# Patient Record
Sex: Female | Born: 1980
Health system: Southern US, Community
[De-identification: ages and names within clinical notes are randomized; demographics above are authoritative.]

## PROBLEM LIST (undated history)

## (undated) DIAGNOSIS — E079 Disorder of thyroid, unspecified: Secondary | ICD-10-CM

## (undated) DIAGNOSIS — Z789 Other specified health status: Secondary | ICD-10-CM

## (undated) DIAGNOSIS — O42913 Preterm premature rupture of membranes, unspecified as to length of time between rupture and onset of labor, third trimester: Secondary | ICD-10-CM

## (undated) DIAGNOSIS — D649 Anemia, unspecified: Secondary | ICD-10-CM

## (undated) HISTORY — DX: Disorder of thyroid, unspecified: E07.9

## (undated) HISTORY — DX: Other specified health status: Z78.9

---

## 2001-11-05 ENCOUNTER — Emergency Department (HOSPITAL_COMMUNITY): Admission: EM | Admit: 2001-11-05 | Discharge: 2001-11-06 | Payer: Self-pay | Admitting: Emergency Medicine

## 2007-02-18 ENCOUNTER — Other Ambulatory Visit: Admission: RE | Admit: 2007-02-18 | Discharge: 2007-02-18 | Payer: Self-pay | Admitting: Gynecology

## 2007-02-18 DIAGNOSIS — Z789 Other specified health status: Secondary | ICD-10-CM

## 2007-02-18 HISTORY — DX: Other specified health status: Z78.9

## 2007-10-24 ENCOUNTER — Emergency Department (HOSPITAL_COMMUNITY): Admission: EM | Admit: 2007-10-24 | Discharge: 2007-10-25 | Payer: Self-pay | Admitting: Emergency Medicine

## 2008-02-20 ENCOUNTER — Other Ambulatory Visit: Admission: RE | Admit: 2008-02-20 | Discharge: 2008-02-20 | Payer: Self-pay | Admitting: Gynecology

## 2009-01-18 ENCOUNTER — Ambulatory Visit: Payer: Self-pay | Admitting: Women's Health

## 2009-02-21 ENCOUNTER — Other Ambulatory Visit: Admission: RE | Admit: 2009-02-21 | Discharge: 2009-02-21 | Payer: Self-pay | Admitting: Gynecology

## 2009-02-21 ENCOUNTER — Ambulatory Visit: Payer: Self-pay | Admitting: Women's Health

## 2009-02-21 ENCOUNTER — Encounter: Payer: Self-pay | Admitting: Women's Health

## 2009-03-14 ENCOUNTER — Ambulatory Visit: Payer: Self-pay | Admitting: Women's Health

## 2009-03-27 ENCOUNTER — Ambulatory Visit: Payer: Self-pay | Admitting: Women's Health

## 2009-11-18 ENCOUNTER — Inpatient Hospital Stay (HOSPITAL_COMMUNITY): Admission: AD | Admit: 2009-11-18 | Discharge: 2009-11-20 | Payer: Self-pay | Admitting: Obstetrics

## 2010-05-22 ENCOUNTER — Ambulatory Visit: Payer: Self-pay | Admitting: Women's Health

## 2010-09-22 LAB — CBC
HCT: 36.7 % (ref 36.0–46.0)
HCT: 36.9 % (ref 36.0–46.0)
Hemoglobin: 10 g/dL — ABNORMAL LOW (ref 12.0–15.0)
MCHC: 34.4 g/dL (ref 30.0–36.0)
MCV: 82.8 fL (ref 78.0–100.0)
MCV: 92 fL (ref 78.0–100.0)
Platelets: 204 10*3/uL (ref 150–400)
Platelets: 228 10*3/uL (ref 150–400)
RBC: 3.47 MIL/uL — ABNORMAL LOW (ref 3.87–5.11)
RDW: 15.3 % (ref 11.5–15.5)
WBC: 10.8 10*3/uL — ABNORMAL HIGH (ref 4.0–10.5)

## 2010-09-22 LAB — COMPREHENSIVE METABOLIC PANEL
AST: 33 U/L (ref 0–37)
Albumin: 2.8 g/dL — ABNORMAL LOW (ref 3.5–5.2)
BUN: 7 mg/dL (ref 6–23)
Creatinine, Ser: 0.67 mg/dL (ref 0.4–1.2)
GFR calc Af Amer: >60 mL/min (ref >60)
Potassium: 4.2 mEq/L (ref 3.5–5.1)
Total Protein: 5.9 g/dL — ABNORMAL LOW (ref 6.0–8.3)

## 2010-09-22 LAB — URIC ACID: Uric Acid, Serum: 6.3 mg/dL (ref 2.4–7.0)

## 2010-09-22 LAB — RPR: RPR Ser Ql: NONREACTIVE

## 2010-11-04 ENCOUNTER — Other Ambulatory Visit (HOSPITAL_COMMUNITY): Payer: Self-pay | Admitting: Endocrinology

## 2010-11-04 DIAGNOSIS — E0590 Thyrotoxicosis, unspecified without thyrotoxic crisis or storm: Secondary | ICD-10-CM

## 2010-11-13 ENCOUNTER — Encounter (HOSPITAL_COMMUNITY)
Admission: RE | Admit: 2010-11-13 | Discharge: 2010-11-13 | Disposition: A | Discharge status: Home / Self Care | Payer: BC Managed Care – PPO | Source: Ambulatory Visit | Attending: Endocrinology | Admitting: Endocrinology

## 2010-11-13 DIAGNOSIS — E0590 Thyrotoxicosis, unspecified without thyrotoxic crisis or storm: Secondary | ICD-10-CM | POA: Insufficient documentation

## 2010-11-14 ENCOUNTER — Encounter (HOSPITAL_COMMUNITY)
Admission: RE | Admit: 2010-11-14 | Discharge: 2010-11-14 | Disposition: A | Discharge status: Home / Self Care | Payer: BC Managed Care – PPO | Source: Ambulatory Visit | Attending: Endocrinology | Admitting: Endocrinology

## 2010-11-14 MED ORDER — SODIUM IODIDE I 131 CAPSULE
8.6000 | Freq: Once | INTRAVENOUS | Status: AC | PRN
  Administered 2010-11-13: 8.6 via ORAL

## 2010-11-14 MED ORDER — SODIUM PERTECHNETATE TC 99M INJECTION
10.4000 | Freq: Once | INTRAVENOUS | Status: AC | PRN
  Administered 2010-11-14: 10.4 via INTRAVENOUS

## 2011-04-02 ENCOUNTER — Telehealth: Payer: Self-pay | Admitting: *Deleted

## 2011-04-02 NOTE — Telephone Encounter (Signed)
Patient just wanted to know if we ever gave her a TDAP vaccine.  Answer was no.

## 2011-04-15 DIAGNOSIS — J45909 Unspecified asthma, uncomplicated: Secondary | ICD-10-CM | POA: Insufficient documentation

## 2011-04-24 ENCOUNTER — Ambulatory Visit (INDEPENDENT_AMBULATORY_CARE_PROVIDER_SITE_OTHER): Payer: BC Managed Care – PPO | Admitting: Women's Health

## 2011-04-24 ENCOUNTER — Encounter: Payer: Self-pay | Admitting: Women's Health

## 2011-04-24 ENCOUNTER — Other Ambulatory Visit (HOSPITAL_COMMUNITY)
Admission: RE | Admit: 2011-04-24 | Discharge: 2011-04-24 | Disposition: A | Discharge status: Home / Self Care | Payer: BC Managed Care – PPO | Source: Ambulatory Visit | Attending: Women's Health | Admitting: Women's Health

## 2011-04-24 VITALS — BP 110/70 | Ht 64.25 in | Wt 181.0 lb

## 2011-04-24 DIAGNOSIS — Z01419 Encounter for gynecological examination (general) (routine) without abnormal findings: Secondary | ICD-10-CM

## 2011-04-24 DIAGNOSIS — E079 Disorder of thyroid, unspecified: Secondary | ICD-10-CM | POA: Insufficient documentation

## 2011-04-24 DIAGNOSIS — R823 Hemoglobinuria: Secondary | ICD-10-CM

## 2011-04-24 NOTE — Progress Notes (Signed)
Samantha Koch March 29, 1981 [id number]    History:    The patient presents for annual exam.  Middle school music teacher. Pt's Father watches daughter [name],17 months.   Past medical history, past surgical history, family history and social history were all reviewed and documented in the EPIC chart.   ROS:  A  ROS was performed and pertinent positives and negatives are included in the history.  Exam:  Filed Vitals:   04/24/11 1546  BP: 110/70    General appearance:  Normal Head/Neck:  Normal, without cervical or supraclavicular adenopathy. Thyroid:  Symmetrical, normal in size, without palpable masses or nodularity. Respiratory  Effort:  Normal  Auscultation:  Clear without wheezing or rhonchi Cardiovascular  Auscultation:  Regular rate, without rubs, murmurs or gallops  Edema/varicosities:  Not grossly evident Abdominal  Soft,nontender, without masses, guarding or rebound.  Liver/spleen:  No organomegaly noted  Hernia:  None appreciated  Skin  Inspection:  Grossly normal  Palpation:  Grossly normal Neurologic/psychiatric  Orientation:  Normal with appropriate conversation.  Mood/affect:  Normal  Genitourinary    Breasts: Examined lying and sitting.     Right: Without masses, retractions, discharge or axillary adenopathy.     Left: Without masses, retractions, discharge or axillary adenopathy.   Inguinal/mons:  Normal without inguinal adenopathy  External genitalia:  Normal  BUS/Urethra/Skene's glands:  Normal  Bladder:  Normal  Vagina:  Normal  Cervix:  Normal  Uterus:   normal in size, shape and contour.  Midline and mobile  Adnexa/parametria:     Rt: Without masses or tenderness.   Lt: Without masses or tenderness.  Anus and perineum: Normal  Digital rectal exam: Normal sphincter tone without palpated masses or tenderness  Assessment/Plan:  30 y.o. MWF G1P1 for annual exam monthly 4-5 day cycle/condoms. History of thyroid problems triggered with pregnancy.  Had negative thyroid scan in May 2012 normal TSH and does have followup next month.  Normal GYN exam  Plan: CBC UA and Pap. SBEs exercise, calcium rich diet, continue multivitamin daily. Reviewed importance of cutting calories and increasing exercise for weight loss to get back to her normal weight. Other options for contraception were reviewed and declined. Not planning on  second child for another 2 years.   Harrington Challenger [facility], 4:38 PM 04/24/2011

## 2011-04-30 ENCOUNTER — Other Ambulatory Visit: Payer: Self-pay | Admitting: Women's Health

## 2011-04-30 DIAGNOSIS — D72829 Elevated white blood cell count, unspecified: Secondary | ICD-10-CM

## 2011-12-01 ENCOUNTER — Ambulatory Visit (INDEPENDENT_AMBULATORY_CARE_PROVIDER_SITE_OTHER): Payer: BC Managed Care – PPO | Admitting: Physician Assistant

## 2011-12-01 ENCOUNTER — Encounter: Payer: Self-pay | Admitting: Physician Assistant

## 2011-12-01 VITALS — BP 121/79 | HR 103 | Temp 99.7°F | Resp 16 | Ht 64.5 in | Wt 173.6 lb

## 2011-12-01 DIAGNOSIS — J029 Acute pharyngitis, unspecified: Secondary | ICD-10-CM

## 2011-12-01 DIAGNOSIS — J309 Allergic rhinitis, unspecified: Secondary | ICD-10-CM

## 2011-12-01 DIAGNOSIS — R509 Fever, unspecified: Secondary | ICD-10-CM

## 2011-12-01 DIAGNOSIS — H6690 Otitis media, unspecified, unspecified ear: Secondary | ICD-10-CM

## 2011-12-01 DIAGNOSIS — M25539 Pain in unspecified wrist: Secondary | ICD-10-CM

## 2011-12-01 LAB — POCT RAPID STREP A (OFFICE): Rapid Strep A Screen: NEGATIVE

## 2011-12-01 MED ORDER — AZITHROMYCIN 250 MG PO TABS: ORAL_TABLET | ORAL | Status: AC

## 2011-12-01 MED ORDER — FLUTICASONE PROPIONATE 50 MCG/ACT NA SUSP: 2.0000 | Freq: Every day | NASAL | Status: DC

## 2011-12-01 MED ORDER — MELOXICAM 15 MG PO TABS: 15.0000 mg | ORAL_TABLET | Freq: Every day | ORAL | Status: AC

## 2011-12-01 NOTE — Progress Notes (Signed)
  Subjective:    Patient ID: Samantha Koch, female    DOB: 1980/10/19, 31 y.o.   MRN: [id number]  HPI Patient presents with sore throat, fever, and left ear pain x 2 days. States symptoms started yesterday morning and have worsened since. Denies nausea, vomiting, nasal congestion, or cough. She has been taking ibuprofen and Dayquil as needed which has helped with the pain and fever. States daughter was sick at over the weekend with a fever and congestion that resolved spontaneously without treatment. 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   She also complains of bilateral wrist pain and numbness especially at night. States she does sleep with her wrists bent or under her head. 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 Has taken ibuprofen prn which has not helped much.    Review of Systems  Constitutional: Positive for fever and chills.  HENT: Positive for ear pain (left). Negative for congestion, rhinorrhea and postnasal drip.   Eyes: Negative for discharge and itching.  Respiratory: Negative for cough and chest tightness.   Musculoskeletal: Positive for myalgias.  Skin: Negative for rash.  Neurological: Positive for headaches (improved with ibuprofen).       Objective:   Physical Exam  Constitutional: She is oriented to person, place, and time. She appears well-developed and well-nourished.  HENT:  Head: Normocephalic and atraumatic.  Right Ear: External ear normal.  Left Ear: External ear normal.  Mouth/Throat: Oropharynx is clear and moist. No oropharyngeal exudate.  Eyes: Conjunctivae are normal.  Neck: Neck supple.  Cardiovascular: Normal rate, regular rhythm and normal heart sounds.   Pulmonary/Chest: Effort normal and breath sounds normal.  Musculoskeletal: Normal range of motion.  Lymphadenopathy:    She has no cervical adenopathy.  Neurological: She is alert and oriented to person, place, and time.  Skin: Skin is warm and dry.    Psychiatric: She has a normal mood and affect. Her behavior is normal. Judgment and thought content normal.          Assessment & Plan:     1. Acute pharyngitis  Likely viral, recommend conservative treatment. If no improvement in 48 hours may fill Zpack POCT rapid strep A, Culture, Group A Strep  2. Fever  Continue ibuprofen or tylenol as needed   3. Otitis media  azithromycin (ZITHROMAX) 250 MG tablet  4. Allergic rhinitis  Changed Nasonex to Flonase for relief of seasonal allergies fluticasone (FLONASE) 50 MCG/ACT nasal spray  5. Wrist pain  Recommend trial of Mobic daily as well as wrist splints at night. Follow up if no improvement meloxicam (MOBIC) 15 MG tablet

## 2011-12-01 NOTE — Patient Instructions (Signed)

## 2011-12-03 LAB — CULTURE, GROUP A STREP: Organism ID, Bacteria: NORMAL

## 2011-12-04 ENCOUNTER — Encounter: Payer: Self-pay | Admitting: *Deleted

## 2011-12-27 ENCOUNTER — Ambulatory Visit (INDEPENDENT_AMBULATORY_CARE_PROVIDER_SITE_OTHER): Payer: BC Managed Care – PPO | Admitting: Emergency Medicine

## 2011-12-27 VITALS — BP 110/73 | HR 73 | Temp 99.0°F | Resp 16 | Ht 64.5 in | Wt 171.0 lb

## 2011-12-27 DIAGNOSIS — J309 Allergic rhinitis, unspecified: Secondary | ICD-10-CM

## 2011-12-27 DIAGNOSIS — H6980 Other specified disorders of Eustachian tube, unspecified ear: Secondary | ICD-10-CM

## 2011-12-27 NOTE — Progress Notes (Signed)
  Subjective:    Patient ID: Samantha Koch, female    DOB: [birth year], 31 y.o.   MRN: [id number]  HPI Comments: Swimming laps while having problems with her allergies and now has sensation that her ears are blocked and full of fluid.  No fever or chills. Clear nasal discharge and post nasal drainage.   No sore throat or cough.  Using flonase  Ear Fullness  There is pain in both ears. This is a new problem. The current episode started in the past 7 days. The problem occurs constantly. The problem has been unchanged. There has been no fever. Associated symptoms include hearing loss and rhinorrhea. Pertinent negatives include no abdominal pain, coughing, diarrhea, drainage, ear discharge, headaches, neck pain, rash, sore throat or vomiting. She has tried nothing for the symptoms. The treatment provided no relief.      Review of Systems  Constitutional: Negative.   HENT: Positive for hearing loss and rhinorrhea. Negative for sore throat, neck pain and ear discharge.   Eyes: Negative.   Respiratory: Negative for cough.   Cardiovascular: Negative.   Gastrointestinal: Negative.  Negative for vomiting, abdominal pain and diarrhea.  Musculoskeletal: Negative.   Skin: Negative for rash.  Neurological: Negative for headaches.       Objective:   Physical Exam  Constitutional: She appears well-developed and well-nourished.  HENT:  Head: Normocephalic and atraumatic.  Right Ear: External ear normal. Tympanic membrane is retracted. A middle ear effusion is present.  Left Ear: External ear normal. Tympanic membrane is retracted. A middle ear effusion is present.  Mouth/Throat: Oropharynx is clear and moist.  Cardiovascular: Normal rate and regular rhythm.   Pulmonary/Chest: Effort normal.  Abdominal: Soft.  Musculoskeletal: Normal range of motion.  Skin: Skin is warm and dry.          Assessment & Plan:  Continue flonase Start Mucinex d Follow up with ENT if not improved

## 2012-01-25 ENCOUNTER — Other Ambulatory Visit: Payer: Self-pay | Admitting: Family Medicine

## 2012-01-25 DIAGNOSIS — N644 Mastodynia: Secondary | ICD-10-CM

## 2012-01-29 ENCOUNTER — Ambulatory Visit
Admission: RE | Admit: 2012-01-29 | Discharge: 2012-01-29 | Disposition: A | Discharge status: Home / Self Care | Payer: BC Managed Care – PPO | Source: Ambulatory Visit | Attending: Family Medicine | Admitting: Family Medicine

## 2012-01-29 ENCOUNTER — Other Ambulatory Visit: Payer: Self-pay | Admitting: Family Medicine

## 2012-01-29 DIAGNOSIS — N644 Mastodynia: Secondary | ICD-10-CM

## 2012-04-28 ENCOUNTER — Encounter: Payer: BC Managed Care – PPO | Admitting: Women's Health

## 2012-05-06 ENCOUNTER — Encounter: Payer: Self-pay | Admitting: Women's Health

## 2012-05-06 ENCOUNTER — Ambulatory Visit (INDEPENDENT_AMBULATORY_CARE_PROVIDER_SITE_OTHER): Payer: BC Managed Care – PPO | Admitting: Women's Health

## 2012-05-06 VITALS — BP 122/80 | Ht 64.5 in | Wt 166.0 lb

## 2012-05-06 DIAGNOSIS — E0590 Thyrotoxicosis, unspecified without thyrotoxic crisis or storm: Secondary | ICD-10-CM

## 2012-05-06 DIAGNOSIS — Z309 Encounter for contraceptive management, unspecified: Secondary | ICD-10-CM

## 2012-05-06 DIAGNOSIS — Z01419 Encounter for gynecological examination (general) (routine) without abnormal findings: Secondary | ICD-10-CM

## 2012-05-06 DIAGNOSIS — IMO0001 Reserved for inherently not codable concepts without codable children: Secondary | ICD-10-CM

## 2012-05-06 LAB — CBC WITH DIFFERENTIAL/PLATELET
Eosinophils Absolute: 0.3 10*3/uL (ref 0.0–0.7)
Lymphocytes Relative: 27 % (ref 12–46)
Lymphs Abs: 3.2 10*3/uL (ref 0.7–4.0)
Neutro Abs: 7.2 10*3/uL (ref 1.7–7.7)
Neutrophils Relative %: 62 % (ref 43–77)
Platelets: 351 10*3/uL (ref 150–400)
RBC: 4.8 MIL/uL (ref 3.87–5.11)
WBC: 11.7 10*3/uL — ABNORMAL HIGH (ref 4.0–10.5)

## 2012-05-06 LAB — GLUCOSE, RANDOM: Glucose, Bld: 81 mg/dL (ref 70–99)

## 2012-05-06 LAB — TSH: TSH: 0.012 u[IU]/mL — ABNORMAL LOW (ref 0.350–4.500)

## 2012-05-06 MED ORDER — NORGESTIMATE-ETH ESTRADIOL 0.25-35 MG-MCG PO TABS: 1.0000 | ORAL_TABLET | Freq: Every day | ORAL | Status: DC

## 2012-05-06 NOTE — Progress Notes (Signed)
Samantha Koch 1980/11/29 [id number]    History:    The patient presents for annual exam.  Monthly cycles/condoms. History of normal Paps. Completed gardasil series in 2009. History of asthma. History of a hyperthyroidism, thyroid ultrasound May 2012 normal, normal TSH in August 2012. Had a normal mammogram with ultrasound 01/2012 for mastodynia.   Past medical history, past surgical history, family history and social history were all reviewed and documented in the EPIC chart. Music teacher middle school, plays the oboe.  Samantha Koch 3 doing well.   ROS:  A  ROS was performed and pertinent positives and negatives are included in the history.  Exam:  Filed Vitals:   05/06/12 0858  BP: 122/80    General appearance:  Normal Head/Neck:  Normal, without cervical or supraclavicular adenopathy. Thyroid:  Symmetrical, normal in size, without palpable masses or nodularity. Respiratory  Effort:  Normal  Auscultation:  Clear without wheezing or rhonchi Cardiovascular  Auscultation:  Regular rate, without rubs, murmurs or gallops  Edema/varicosities:  Not grossly evident Abdominal  Soft,nontender, without masses, guarding or rebound.  Liver/spleen:  No organomegaly noted  Hernia:  None appreciated  Skin  Inspection:  Grossly normal  Palpation:  Grossly normal Neurologic/psychiatric  Orientation:  Normal with appropriate conversation.  Mood/affect:  Normal  Genitourinary    Breasts: Examined lying and sitting/left larger than right always.     Right: Without masses, retractions, discharge or axillary adenopathy.     Left: Without masses, retractions, discharge or axillary adenopathy.   Inguinal/mons:  Normal without inguinal adenopathy  External genitalia:  Normal  BUS/Urethra/Skene's glands:  Normal  Bladder:  Normal  Vagina:  Normal  Cervix:  Normal  Uterus:   normal in size, shape and contour.  Midline and mobile  Adnexa/parametria:     Rt: Without masses or  tenderness.   Lt: Without masses or tenderness.  Anus and perineum: Normal  Digital rectal exam: Normal sphincter tone without palpated masses or tenderness  Assessment/Plan:  31 y.o. M. WF G1 P1 for annual exam without complaint.  Normal GYN exam History of hyperthyroidism postpartum Contraception counseling  Plan: CBC, TSH, glucose, UA. Pap normal 2012, new screening guidelines reviewed. SBE's, exercise, calcium rich diet, MVI daily encouraged. Has lost approximately 20 pounds in the past year with diet and exercise will continue healthy lifestyle. Contraception options reviewed will try Ortho-Cyclen prescription, proper use given and reviewed start with next cycle condoms until then and first month on.   Harrington Challenger [facility], 1:47 PM 05/06/2012

## 2012-05-06 NOTE — Patient Instructions (Addendum)

## 2012-06-16 ENCOUNTER — Other Ambulatory Visit (HOSPITAL_COMMUNITY): Payer: Self-pay | Admitting: Endocrinology

## 2012-06-16 DIAGNOSIS — E0590 Thyrotoxicosis, unspecified without thyrotoxic crisis or storm: Secondary | ICD-10-CM

## 2012-07-11 ENCOUNTER — Encounter (HOSPITAL_COMMUNITY)
Admission: RE | Admit: 2012-07-11 | Discharge: 2012-07-11 | Disposition: A | Discharge status: Home / Self Care | Payer: BC Managed Care – PPO | Source: Ambulatory Visit | Attending: Endocrinology | Admitting: Endocrinology

## 2012-07-11 DIAGNOSIS — E0590 Thyrotoxicosis, unspecified without thyrotoxic crisis or storm: Secondary | ICD-10-CM

## 2012-07-12 ENCOUNTER — Encounter (HOSPITAL_COMMUNITY)
Admission: RE | Admit: 2012-07-12 | Discharge: 2012-07-12 | Disposition: A | Discharge status: Home / Self Care | Payer: BC Managed Care – PPO | Source: Ambulatory Visit | Attending: Endocrinology | Admitting: Endocrinology

## 2012-07-12 MED ORDER — SODIUM IODIDE I 131 CAPSULE
8.0000 | Freq: Once | INTRAVENOUS | Status: AC | PRN
  Administered 2012-07-12: 8 via ORAL

## 2012-07-12 MED ORDER — SODIUM PERTECHNETATE TC 99M INJECTION
11.0000 | Freq: Once | INTRAVENOUS | Status: AC | PRN
  Administered 2012-07-12: 11 via INTRAVENOUS

## 2012-07-25 ENCOUNTER — Other Ambulatory Visit (HOSPITAL_COMMUNITY): Payer: Self-pay | Admitting: Endocrinology

## 2012-07-25 DIAGNOSIS — E0590 Thyrotoxicosis, unspecified without thyrotoxic crisis or storm: Secondary | ICD-10-CM

## 2012-07-27 ENCOUNTER — Ambulatory Visit (HOSPITAL_COMMUNITY): Payer: BC Managed Care – PPO

## 2012-07-28 ENCOUNTER — Ambulatory Visit (HOSPITAL_COMMUNITY)
Admission: RE | Admit: 2012-07-28 | Discharge: 2012-07-28 | Disposition: A | Discharge status: Home / Self Care | Payer: BC Managed Care – PPO | Source: Ambulatory Visit | Attending: Endocrinology | Admitting: Endocrinology

## 2012-07-28 DIAGNOSIS — R599 Enlarged lymph nodes, unspecified: Secondary | ICD-10-CM | POA: Insufficient documentation

## 2012-07-28 DIAGNOSIS — E0590 Thyrotoxicosis, unspecified without thyrotoxic crisis or storm: Secondary | ICD-10-CM | POA: Insufficient documentation

## 2012-08-09 ENCOUNTER — Other Ambulatory Visit (HOSPITAL_COMMUNITY): Payer: Self-pay | Admitting: Endocrinology

## 2012-08-09 DIAGNOSIS — E0590 Thyrotoxicosis, unspecified without thyrotoxic crisis or storm: Secondary | ICD-10-CM

## 2012-09-09 ENCOUNTER — Encounter (HOSPITAL_COMMUNITY)
Admission: RE | Admit: 2012-09-09 | Discharge: 2012-09-09 | Disposition: A | Discharge status: Home / Self Care | Payer: BC Managed Care – PPO | Source: Ambulatory Visit | Attending: Endocrinology | Admitting: Endocrinology

## 2012-09-23 ENCOUNTER — Encounter (HOSPITAL_COMMUNITY)
Admission: RE | Admit: 2012-09-23 | Discharge: 2012-09-23 | Disposition: A | Discharge status: Home / Self Care | Payer: BC Managed Care – PPO | Source: Ambulatory Visit | Attending: Endocrinology | Admitting: Endocrinology

## 2012-09-23 DIAGNOSIS — E0590 Thyrotoxicosis, unspecified without thyrotoxic crisis or storm: Secondary | ICD-10-CM | POA: Insufficient documentation

## 2012-09-23 LAB — HCG, SERUM, QUALITATIVE: Preg, Serum: NEGATIVE

## 2012-09-23 MED ORDER — SODIUM IODIDE I 131 CAPSULE
18.9000 | Freq: Once | INTRAVENOUS | Status: AC | PRN
  Administered 2012-09-23: 18.9 via ORAL

## 2013-05-10 ENCOUNTER — Other Ambulatory Visit (HOSPITAL_COMMUNITY)
Admission: RE | Admit: 2013-05-10 | Discharge: 2013-05-10 | Disposition: A | Discharge status: Home / Self Care | Payer: BC Managed Care – PPO | Source: Ambulatory Visit | Attending: Gynecology | Admitting: Gynecology

## 2013-05-10 ENCOUNTER — Encounter: Payer: Self-pay | Admitting: Women's Health

## 2013-05-10 ENCOUNTER — Ambulatory Visit (INDEPENDENT_AMBULATORY_CARE_PROVIDER_SITE_OTHER): Payer: BC Managed Care – PPO | Admitting: Women's Health

## 2013-05-10 VITALS — BP 116/72 | Ht 64.5 in | Wt 175.6 lb

## 2013-05-10 DIAGNOSIS — F419 Anxiety disorder, unspecified: Secondary | ICD-10-CM

## 2013-05-10 DIAGNOSIS — E079 Disorder of thyroid, unspecified: Secondary | ICD-10-CM

## 2013-05-10 DIAGNOSIS — Z01419 Encounter for gynecological examination (general) (routine) without abnormal findings: Secondary | ICD-10-CM

## 2013-05-10 DIAGNOSIS — Z833 Family history of diabetes mellitus: Secondary | ICD-10-CM

## 2013-05-10 DIAGNOSIS — F341 Dysthymic disorder: Secondary | ICD-10-CM

## 2013-05-10 DIAGNOSIS — F411 Generalized anxiety disorder: Secondary | ICD-10-CM

## 2013-05-10 DIAGNOSIS — F329 Major depressive disorder, single episode, unspecified: Secondary | ICD-10-CM | POA: Insufficient documentation

## 2013-05-10 LAB — CBC WITH DIFFERENTIAL/PLATELET
Basophils Absolute: 0 10*3/uL (ref 0.0–0.1)
Basophils Relative: 0 % (ref 0–1)
Eosinophils Absolute: 0.2 10*3/uL (ref 0.0–0.7)
MCH: 27 pg (ref 26.0–34.0)
MCHC: 34 g/dL (ref 30.0–36.0)
Neutro Abs: 9 10*3/uL — ABNORMAL HIGH (ref 1.7–7.7)
Neutrophils Relative %: 72 % (ref 43–77)
Platelets: 295 10*3/uL (ref 150–400)
RDW: 12.8 % (ref 11.5–15.5)

## 2013-05-10 LAB — GLUCOSE, RANDOM: Glucose, Bld: 104 mg/dL — ABNORMAL HIGH (ref 70–99)

## 2013-05-10 MED ORDER — ALPRAZOLAM 0.25 MG PO TABS: 0.2500 mg | ORAL_TABLET | Freq: Every evening | ORAL | Status: DC | PRN

## 2013-05-10 MED ORDER — CITALOPRAM HYDROBROMIDE 10 MG PO TABS: 10.0000 mg | ORAL_TABLET | Freq: Every day | ORAL | Status: DC

## 2013-05-10 NOTE — Patient Instructions (Signed)

## 2013-05-10 NOTE — Progress Notes (Signed)
Samantha Koch Jan 01, 1981 [id number]    History:    The patient presents for annual exam.  Monthly cycle/condoms. Hyperthyroid postpartum, radioiodine treatment March 2014 now on Synthroid followed by Dr. Talmage Koch. Gardasil series completed 2009. Normal Paps. Normal mammograms 2013 for mastodynia. History of depression and anxiety, Celexa in the past. Denies suicidal ideation or abuse.   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 Parents moved here to help with Samantha Koch. Father hypertension and all grandparents. Mother thyroid disease.   ROS:  A  ROS was performed and pertinent positives and negatives are included in the history.  Exam:  Filed Vitals:   05/10/13 1432  BP: 116/72    General appearance:  Normal Head/Neck:  Normal, without cervical or supraclavicular adenopathy. Thyroid:  Symmetrical, normal in size, without palpable masses or nodularity. Respiratory  Effort:  Normal  Auscultation:  Clear without wheezing or rhonchi Cardiovascular  Auscultation:  Regular rate, without rubs, murmurs or gallops  Edema/varicosities:  Not grossly evident Abdominal  Soft,nontender, without masses, guarding or rebound.  Liver/spleen:  No organomegaly noted  Hernia:  None appreciated  Skin  Inspection:  Grossly normal  Palpation:  Grossly normal Neurologic/psychiatric  Orientation:  Normal with appropriate conversation.  Mood/affect:  Normal  Genitourinary    Breasts: Examined lying and sitting. Left always larger than right    Right: Without masses, retractions, discharge or axillary adenopathy.     Left: Without masses, retractions, discharge or axillary adenopathy.   Inguinal/mons:  Normal without inguinal adenopathy  External genitalia:  Normal  BUS/Urethra/Skene's glands:  Normal  Bladder:  Normal  Vagina:  Normal  Cervix:  Normal  Uterus:   normal  in size, shape and contour.  Midline and mobile  Adnexa/parametria:     Rt: Without masses or tenderness.   Lt: Without masses or tenderness.  Anus and perineum: Normal  Digital rectal exam: Normal sphincter tone without palpated masses or tenderness  Assessment/Plan:  32 y.o.  MWF G1P1 for annual exam with complaint of being short tempered, anxiety with depression.  Normal GYN exam Hypothyroid-Dr. Talmage Koch managing Anxiety/depression  Plan: Contraception options reviewed will continue condoms. Counseling encouraged, Celexa 10 mg, reviewed low-dose may need to increase dosage.(celexa in the past unsure of dosage)  Reviewed importance of increasing leisure, exercise. SBE's, calcium rich diet, MVI daily encouraged. CBC, glucose, UA, Pap. Pap normal 2012, new screening guidelines reviewed.   Samantha Koch [facility], 4:58 PM 05/10/2013

## 2013-05-11 LAB — URINALYSIS W MICROSCOPIC + REFLEX CULTURE
Bacteria, UA: NONE SEEN
Casts: NONE SEEN
Ketones, ur: NEGATIVE mg/dL
Nitrite: NEGATIVE
Protein, ur: NEGATIVE mg/dL
pH: 6 (ref 5.0–8.0)

## 2014-05-07 ENCOUNTER — Encounter: Payer: Self-pay | Admitting: Women's Health

## 2014-05-16 ENCOUNTER — Encounter: Payer: Self-pay | Admitting: Women's Health

## 2014-05-16 ENCOUNTER — Other Ambulatory Visit: Payer: Self-pay | Admitting: Women's Health

## 2014-05-16 ENCOUNTER — Ambulatory Visit (INDEPENDENT_AMBULATORY_CARE_PROVIDER_SITE_OTHER): Payer: BC Managed Care – PPO | Admitting: Women's Health

## 2014-05-16 VITALS — BP 120/84 | Ht 65.0 in | Wt 181.0 lb

## 2014-05-16 DIAGNOSIS — E038 Other specified hypothyroidism: Secondary | ICD-10-CM

## 2014-05-16 DIAGNOSIS — N912 Amenorrhea, unspecified: Secondary | ICD-10-CM

## 2014-05-16 DIAGNOSIS — Z01419 Encounter for gynecological examination (general) (routine) without abnormal findings: Secondary | ICD-10-CM

## 2014-05-16 LAB — CBC WITH DIFFERENTIAL/PLATELET
Basophils Absolute: 0 10*3/uL (ref 0.0–0.1)
Basophils Relative: 0 % (ref 0–1)
EOS ABS: 0.2 10*3/uL (ref 0.0–0.7)
EOS PCT: 2 % (ref 0–5)
HEMATOCRIT: 38.9 % (ref 36.0–46.0)
Hemoglobin: 12.9 g/dL (ref 12.0–15.0)
LYMPHS ABS: 3.2 10*3/uL (ref 0.7–4.0)
Lymphocytes Relative: 26 % (ref 12–46)
MCH: 26.8 pg (ref 26.0–34.0)
MCHC: 33.2 g/dL (ref 30.0–36.0)
MCV: 80.7 fL (ref 78.0–100.0)
MONO ABS: 0.9 10*3/uL (ref 0.1–1.0)
Monocytes Relative: 7 % (ref 3–12)
Neutro Abs: 8.1 10*3/uL — ABNORMAL HIGH (ref 1.7–7.7)
Neutrophils Relative %: 65 % (ref 43–77)
PLATELETS: 334 10*3/uL (ref 150–400)
RBC: 4.82 MIL/uL (ref 3.87–5.11)
RDW: 13.9 % (ref 11.5–15.5)
WBC: 12.4 10*3/uL — ABNORMAL HIGH (ref 4.0–10.5)

## 2014-05-16 LAB — PREGNANCY, URINE: Preg Test, Ur: NEGATIVE

## 2014-05-16 LAB — TSH: TSH: 1.822 u[IU]/mL (ref 0.350–4.500)

## 2014-05-16 NOTE — Patient Instructions (Signed)

## 2014-05-16 NOTE — Progress Notes (Signed)
Samantha QuitterMarcia A Koch [birth year] [id number]    History:    Presents for annual exam.  Monthly cycle, stopped contraception last month planning second baby. No problems with first pregnancy. Hyperthyroid had radioactive iodine 09/2012 doing well Dr. Talmage NapBalan manages, on Synthroid. History of anxiety and depression in the past had been on Celexa is no longer on any medication doing well. Normal Pap history.  Past medical history, past surgical history, family history and social history were all reviewed and documented in the EPIC chart. 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 [place] helping to care for Samantha Koch.  ROS:  A  12 point ROS was performed and pertinent positives and negatives are included.  Exam:  Filed Vitals:   05/16/14 0905  BP: 120/84    General appearance:  Normal Thyroid:  Symmetrical, normal in size, without palpable masses or nodularity. Respiratory  Auscultation:  Clear without wheezing or rhonchi Cardiovascular  Auscultation:  Regular rate, without rubs, murmurs or gallops  Edema/varicosities:  Not grossly evident Abdominal  Soft,nontender, without masses, guarding or rebound.  Liver/spleen:  No organomegaly noted  Hernia:  None appreciated  Skin  Inspection:  Grossly normal   Breasts: Examined lying and sitting.     Right: Without masses, retractions, discharge or axillary adenopathy.     Left: Without masses, retractions, discharge or axillary adenopathy. Gentitourinary   Inguinal/mons:  Normal without inguinal adenopathy  External genitalia:  Normal  BUS/Urethra/Skene's glands:  Normal  Vagina:  Normal  Cervix:  Normal  Uterus:   normal in size, shape and contour.  Midline and mobile  Adnexa/parametria:     Rt: Without masses or tenderness.   Lt: Without masses or tenderness.  Anus and perineum: Normal  Digital rectal exam: Normal sphincter tone without palpated masses or tenderness  Assessment/Plan:  33 y.o.MWF  G1P1  for annual examwith no complaints.  Hypothyroid on Synthroid Desiring conception  Plan: UPT negative, will return to office with positive home UPT for viability and dating ultrasound. Aware we no longer deliver. SBE's, continue regular exercise, calcium rich diet, prenatal vitamin daily. Aware of safe pregnancy behaviors. CBC, TSH, UA, Pap normal 2014, new screening guidelines reviewed.    Samantha Koch,Samantha Koch [facility] Hospital[facility]NP, 9:43 AM 05/16/2014

## 2014-06-20 ENCOUNTER — Ambulatory Visit (INDEPENDENT_AMBULATORY_CARE_PROVIDER_SITE_OTHER): Payer: BC Managed Care – PPO | Admitting: Women's Health

## 2014-06-20 ENCOUNTER — Encounter: Payer: Self-pay | Admitting: Women's Health

## 2014-06-20 VITALS — BP 126/80 | Ht 64.0 in | Wt 184.0 lb

## 2014-06-20 DIAGNOSIS — N912 Amenorrhea, unspecified: Secondary | ICD-10-CM

## 2014-06-20 LAB — PREGNANCY, URINE: Preg Test, Ur: POSITIVE

## 2014-06-20 NOTE — Patient Instructions (Signed)
First Trimester of Pregnancy The first trimester of pregnancy is from week 1 until the end of week 12 (months 1 through 3). A week after a sperm fertilizes an egg, the egg will implant on the wall of the uterus. This embryo will begin to develop into a baby. Genes from you and your partner are forming the baby. The female genes determine whether the baby is a boy or a girl. At 6-8 weeks, the eyes and face are formed, and the heartbeat can be seen on ultrasound. At the end of 12 weeks, all the baby's organs are formed.  Now that you are pregnant, you will want to do everything you can to have a healthy baby. Two of the most important things are to get good prenatal care and to follow your health care provider's instructions. Prenatal care is all the medical care you receive before the baby's birth. This care will help prevent, find, and treat any problems during the pregnancy and childbirth. BODY CHANGES Your body goes through many changes during pregnancy. The changes vary from woman to woman.   You may gain or lose a couple of pounds at first.  You may feel sick to your stomach (nauseous) and throw up (vomit). If the vomiting is uncontrollable, call your health care provider.  You may tire easily.  You may develop headaches that can be relieved by medicines approved by your health care provider.  You may urinate more often. Painful urination may mean you have a bladder infection.  You may develop heartburn as a result of your pregnancy.  You may develop constipation because certain hormones are causing the muscles that push waste through your intestines to slow down.  You may develop hemorrhoids or swollen, bulging veins (varicose veins).  Your breasts may begin to grow larger and become tender. Your nipples may stick out more, and the tissue that surrounds them (areola) may become darker.  Your gums may bleed and may be sensitive to brushing and flossing.  Dark spots or blotches (chloasma,  mask of pregnancy) may develop on your face. This will likely fade after the baby is born.  Your menstrual periods will stop.  You may have a loss of appetite.  You may develop cravings for certain kinds of food.  You may have changes in your emotions from day to day, such as being excited to be pregnant or being concerned that something may go wrong with the pregnancy and baby.  You may have more vivid and strange dreams.  You may have changes in your hair. These can include thickening of your hair, rapid growth, and changes in texture. Some women also have hair loss during or after pregnancy, or hair that feels dry or thin. Your hair will most likely return to normal after your baby is born. WHAT TO EXPECT AT YOUR PRENATAL VISITS During a routine prenatal visit:  You will be weighed to make sure you and the baby are growing normally.  Your blood pressure will be taken.  Your abdomen will be measured to track your baby's growth.  The fetal heartbeat will be listened to starting around week 10 or 12 of your pregnancy.  Test results from any previous visits will be discussed. Your health care provider may ask you:  How you are feeling.  If you are feeling the baby move.  If you have had any abnormal symptoms, such as leaking fluid, bleeding, severe headaches, or abdominal cramping.  If you have any questions. Other tests   that may be performed during your first trimester include:  Blood tests to find your blood type and to check for the presence of any previous infections. They will also be used to check for low iron levels (anemia) and Rh antibodies. Later in the pregnancy, blood tests for diabetes will be done along with other tests if problems develop.  Urine tests to check for infections, diabetes, or protein in the urine.  An ultrasound to confirm the proper growth and development of the baby.  An amniocentesis to check for possible genetic problems.  Fetal screens for  spina bifida and Down syndrome.  You may need other tests to make sure you and the baby are doing well. HOME CARE INSTRUCTIONS  Medicines  Follow your health care provider's instructions regarding medicine use. Specific medicines may be either safe or unsafe to take during pregnancy.  Take your prenatal vitamins as directed.  If you develop constipation, try taking a stool softener if your health care provider approves. Diet  Eat regular, well-balanced meals. Choose a variety of foods, such as meat or vegetable-based protein, fish, milk and low-fat dairy products, vegetables, fruits, and whole grain breads and cereals. Your health care provider will help you determine the amount of weight gain that is right for you.  Avoid raw meat and uncooked cheese. These carry germs that can cause birth defects in the baby.  Eating four or five small meals rather than three large meals a day may help relieve nausea and vomiting. If you start to feel nauseous, eating a few soda crackers can be helpful. Drinking liquids between meals instead of during meals also seems to help nausea and vomiting.  If you develop constipation, eat more high-fiber foods, such as fresh vegetables or fruit and whole grains. Drink enough fluids to keep your urine clear or pale yellow. Activity and Exercise  Exercise only as directed by your health care provider. Exercising will help you:  Control your weight.  Stay in shape.  Be prepared for labor and delivery.  Experiencing pain or cramping in the lower abdomen or low back is a good sign that you should stop exercising. Check with your health care provider before continuing normal exercises.  Try to avoid standing for long periods of time. Move your legs often if you must stand in one place for a long time.  Avoid heavy lifting.  Wear low-heeled shoes, and practice good posture.  You may continue to have sex unless your health care provider directs you  otherwise. Relief of Pain or Discomfort  Wear a good support bra for breast tenderness.   Take warm sitz baths to soothe any pain or discomfort caused by hemorrhoids. Use hemorrhoid cream if your health care provider approves.   Rest with your legs elevated if you have leg cramps or low back pain.  If you develop varicose veins in your legs, wear support hose. Elevate your feet for 15 minutes, 3-4 times a day. Limit salt in your diet. Prenatal Care  Schedule your prenatal visits by the twelfth week of pregnancy. They are usually scheduled monthly at first, then more often in the last 2 months before delivery.  Write down your questions. Take them to your prenatal visits.  Keep all your prenatal visits as directed by your health care provider. Safety  Wear your seat belt at all times when driving.  Make a list of emergency phone numbers, including numbers for family, friends, the hospital, and police and fire departments. General Tips    Ask your health care provider for a referral to a local prenatal education class. Begin classes no later than at the beginning of month 6 of your pregnancy.  Ask for help if you have counseling or nutritional needs during pregnancy. Your health care provider can offer advice or refer you to specialists for help with various needs.  Do not use hot tubs, steam rooms, or saunas.  Do not douche or use tampons or scented sanitary pads.  Do not cross your legs for long periods of time.  Avoid cat litter boxes and soil used by cats. These carry germs that can cause birth defects in the baby and possibly loss of the fetus by miscarriage or stillbirth.  Avoid all smoking, herbs, alcohol, and medicines not prescribed by your health care provider. Chemicals in these affect the formation and growth of the baby.  Schedule a dentist appointment. At home, brush your teeth with a soft toothbrush and be gentle when you floss. SEEK MEDICAL CARE IF:   You have  dizziness.  You have mild pelvic cramps, pelvic pressure, or nagging pain in the abdominal area.  You have persistent nausea, vomiting, or diarrhea.  You have a bad smelling vaginal discharge.  You have pain with urination.  You notice increased swelling in your face, hands, legs, or ankles. SEEK IMMEDIATE MEDICAL CARE IF:   You have a fever.  You are leaking fluid from your vagina.  You have spotting or bleeding from your vagina.  You have severe abdominal cramping or pain.  You have rapid weight gain or loss.  You vomit blood or material that looks like coffee grounds.  You are exposed to German measles and have never had them.  You are exposed to fifth disease or chickenpox.  You develop a severe headache.  You have shortness of breath.  You have any kind of trauma, such as from a fall or a car accident. Document Released: 06/16/2001 Document Revised: 11/06/2013 Document Reviewed: 05/02/2013 Exit[employer] Patient Information 2015 [employer]. This information is not intended to replace advice given to you by your health care provider. Make sure you discuss any questions you have with your health care provider.  

## 2014-06-20 NOTE — Progress Notes (Signed)
Patient ID: [name], female   DOB: 01/21/1981, 33 y.o.   MRN: [id number] Presents with home positive UPT. Stopped contraception in October, last cycle November 14 normal flow. Denies abdominal pain, bleeding, discharge or urinary symptoms. Some nausea without vomiting. Taking prenatal vitamin and Synthroid daily.   Exam: Appears well. UPT positive  Early pregnancy  Plan: Continue prenatal vitamins daily, safe pregnancy behaviors reviewed. Schedule viability ultrasound first week in January. [facility]ware [facility] no longer [facility], [facility] who is 4-1/2. Continue Synthroid, normal TSH last month.

## 2014-06-22 ENCOUNTER — Ambulatory Visit: Payer: BC Managed Care – PPO | Admitting: Women's Health

## 2014-07-05 ENCOUNTER — Other Ambulatory Visit: Payer: Self-pay | Admitting: Women's Health

## 2014-07-05 DIAGNOSIS — O3680X1 Pregnancy with inconclusive fetal viability, fetus 1: Secondary | ICD-10-CM

## 2014-07-05 DIAGNOSIS — N912 Amenorrhea, unspecified: Secondary | ICD-10-CM

## 2014-07-06 NOTE — L&D Delivery Note (Signed)
Delivery Note  After preterm PROM at 34 wks, unclear etiology of PROM, and good progress on pitocin At 2:25 PM a viable female was delivered via Vaginal, Spontaneous Delivery (Presentation: Left Occiput Anterior).  APGAR: 9, ; weight 5 lb 12.4 oz (2620 g).   Placenta status: Intact, Spontaneous.  Cord: 3 vessels with the following complications: None.  Cord pH: pending  Anesthesia: Epidural  Episiotomy: None Lacerations: 2nd degree Suture Repair: 3.0 vicryl rapide Est. Blood Loss (mL): 100  Mom to postpartum.  Baby to NICU.    Rickardo Brinegar A. 01/18/2015, 3:16 PM

## 2014-07-12 ENCOUNTER — Ambulatory Visit: Payer: BC Managed Care – PPO | Admitting: Women's Health

## 2014-07-12 ENCOUNTER — Other Ambulatory Visit: Payer: BC Managed Care – PPO

## 2014-07-13 ENCOUNTER — Ambulatory Visit (INDEPENDENT_AMBULATORY_CARE_PROVIDER_SITE_OTHER): Payer: BC Managed Care – PPO | Admitting: Women's Health

## 2014-07-13 ENCOUNTER — Encounter: Payer: Self-pay | Admitting: Women's Health

## 2014-07-13 ENCOUNTER — Ambulatory Visit (INDEPENDENT_AMBULATORY_CARE_PROVIDER_SITE_OTHER): Payer: BC Managed Care – PPO

## 2014-07-13 DIAGNOSIS — O3680X0 Pregnancy with inconclusive fetal viability, not applicable or unspecified: Secondary | ICD-10-CM

## 2014-07-13 DIAGNOSIS — N912 Amenorrhea, unspecified: Secondary | ICD-10-CM

## 2014-07-13 DIAGNOSIS — O3680X1 Pregnancy with inconclusive fetal viability, fetus 1: Secondary | ICD-10-CM

## 2014-07-13 DIAGNOSIS — E038 Other specified hypothyroidism: Secondary | ICD-10-CM

## 2014-07-13 LAB — US OB TRANSVAGINAL

## 2014-07-13 NOTE — Patient Instructions (Signed)
First Trimester of Pregnancy The first trimester of pregnancy is from week 1 until the end of week 12 (months 1 through 3). A week after a sperm fertilizes an egg, the egg will implant on the wall of the uterus. This embryo will begin to develop into a baby. Genes from you and your partner are forming the baby. The female genes determine whether the baby is a boy or a girl. At 6-8 weeks, the eyes and face are formed, and the heartbeat can be seen on ultrasound. At the end of 12 weeks, all the baby's organs are formed.  Now that you are pregnant, you will want to do everything you can to have a healthy baby. Two of the most important things are to get good prenatal care and to follow your health care provider's instructions. Prenatal care is all the medical care you receive before the baby's birth. This care will help prevent, find, and treat any problems during the pregnancy and childbirth. BODY CHANGES Your body goes through many changes during pregnancy. The changes vary from woman to woman.   You may gain or lose a couple of pounds at first.  You may feel sick to your stomach (nauseous) and throw up (vomit). If the vomiting is uncontrollable, call your health care provider.  You may tire easily.  You may develop headaches that can be relieved by medicines approved by your health care provider.  You may urinate more often. Painful urination may mean you have a bladder infection.  You may develop heartburn as a result of your pregnancy.  You may develop constipation because certain hormones are causing the muscles that push waste through your intestines to slow down.  You may develop hemorrhoids or swollen, bulging veins (varicose veins).  Your breasts may begin to grow larger and become tender. Your nipples may stick out more, and the tissue that surrounds them (areola) may become darker.  Your gums may bleed and may be sensitive to brushing and flossing.  Dark spots or blotches (chloasma,  mask of pregnancy) may develop on your face. This will likely fade after the baby is born.  Your menstrual periods will stop.  You may have a loss of appetite.  You may develop cravings for certain kinds of food.  You may have changes in your emotions from day to day, such as being excited to be pregnant or being concerned that something may go wrong with the pregnancy and baby.  You may have more vivid and strange dreams.  You may have changes in your hair. These can include thickening of your hair, rapid growth, and changes in texture. Some women also have hair loss during or after pregnancy, or hair that feels dry or thin. Your hair will most likely return to normal after your baby is born. WHAT TO EXPECT AT YOUR PRENATAL VISITS During a routine prenatal visit:  You will be weighed to make sure you and the baby are growing normally.  Your blood pressure will be taken.  Your abdomen will be measured to track your baby's growth.  The fetal heartbeat will be listened to starting around week 10 or 12 of your pregnancy.  Test results from any previous visits will be discussed. Your health care provider may ask you:  How you are feeling.  If you are feeling the baby move.  If you have had any abnormal symptoms, such as leaking fluid, bleeding, severe headaches, or abdominal cramping.  If you have any questions. Other tests   that may be performed during your first trimester include:  Blood tests to find your blood type and to check for the presence of any previous infections. They will also be used to check for low iron levels (anemia) and Rh antibodies. Later in the pregnancy, blood tests for diabetes will be done along with other tests if problems develop.  Urine tests to check for infections, diabetes, or protein in the urine.  An ultrasound to confirm the proper growth and development of the baby.  An amniocentesis to check for possible genetic problems.  Fetal screens for  spina bifida and Down syndrome.  You may need other tests to make sure you and the baby are doing well. HOME CARE INSTRUCTIONS  Medicines  Follow your health care provider's instructions regarding medicine use. Specific medicines may be either safe or unsafe to take during pregnancy.  Take your prenatal vitamins as directed.  If you develop constipation, try taking a stool softener if your health care provider approves. Diet  Eat regular, well-balanced meals. Choose a variety of foods, such as meat or vegetable-based protein, fish, milk and low-fat dairy products, vegetables, fruits, and whole grain breads and cereals. Your health care provider will help you determine the amount of weight gain that is right for you.  Avoid raw meat and uncooked cheese. These carry germs that can cause birth defects in the baby.  Eating four or five small meals rather than three large meals a day may help relieve nausea and vomiting. If you start to feel nauseous, eating a few soda crackers can be helpful. Drinking liquids between meals instead of during meals also seems to help nausea and vomiting.  If you develop constipation, eat more high-fiber foods, such as fresh vegetables or fruit and whole grains. Drink enough fluids to keep your urine clear or pale yellow. Activity and Exercise  Exercise only as directed by your health care provider. Exercising will help you:  Control your weight.  Stay in shape.  Be prepared for labor and delivery.  Experiencing pain or cramping in the lower abdomen or low back is a good sign that you should stop exercising. Check with your health care provider before continuing normal exercises.  Try to avoid standing for long periods of time. Move your legs often if you must stand in one place for a long time.  Avoid heavy lifting.  Wear low-heeled shoes, and practice good posture.  You may continue to have sex unless your health care provider directs you  otherwise. Relief of Pain or Discomfort  Wear a good support bra for breast tenderness.   Take warm sitz baths to soothe any pain or discomfort caused by hemorrhoids. Use hemorrhoid cream if your health care provider approves.   Rest with your legs elevated if you have leg cramps or low back pain.  If you develop varicose veins in your legs, wear support hose. Elevate your feet for 15 minutes, 3-4 times a day. Limit salt in your diet. Prenatal Care  Schedule your prenatal visits by the twelfth week of pregnancy. They are usually scheduled monthly at first, then more often in the last 2 months before delivery.  Write down your questions. Take them to your prenatal visits.  Keep all your prenatal visits as directed by your health care provider. Safety  Wear your seat belt at all times when driving.  Make a list of emergency phone numbers, including numbers for family, friends, the hospital, and police and fire departments. General Tips    Ask your health care provider for a referral to a local prenatal education class. Begin classes no later than at the beginning of month 6 of your pregnancy.  Ask for help if you have counseling or nutritional needs during pregnancy. Your health care provider can offer advice or refer you to specialists for help with various needs.  Do not use hot tubs, steam rooms, or saunas.  Do not douche or use tampons or scented sanitary pads.  Do not cross your legs for long periods of time.  Avoid cat litter boxes and soil used by cats. These carry germs that can cause birth defects in the baby and possibly loss of the fetus by miscarriage or stillbirth.  Avoid all smoking, herbs, alcohol, and medicines not prescribed by your health care provider. Chemicals in these affect the formation and growth of the baby.  Schedule a dentist appointment. At home, brush your teeth with a soft toothbrush and be gentle when you floss. SEEK MEDICAL CARE IF:   You have  dizziness.  You have mild pelvic cramps, pelvic pressure, or nagging pain in the abdominal area.  You have persistent nausea, vomiting, or diarrhea.  You have a bad smelling vaginal discharge.  You have pain with urination.  You notice increased swelling in your face, hands, legs, or ankles. SEEK IMMEDIATE MEDICAL CARE IF:   You have a fever.  You are leaking fluid from your vagina.  You have spotting or bleeding from your vagina.  You have severe abdominal cramping or pain.  You have rapid weight gain or loss.  You vomit blood or material that looks like coffee grounds.  You are exposed to German measles and have never had them.  You are exposed to fifth disease or chickenpox.  You develop a severe headache.  You have shortness of breath.  You have any kind of trauma, such as from a fall or a car accident. Document Released: 06/16/2001 Document Revised: 11/06/2013 Document Reviewed: 05/02/2013 Exit[employer] Patient Information 2015 [employer]. This information is not intended to replace advice given to you by your health care provider. Make sure you discuss any questions you have with your health care provider.  

## 2014-07-13 NOTE — Progress Notes (Signed)
Presents for viability ultrasound. LMP 05/19/2014, normal cycles.  . Normal Pap history. Denies abdominal pain, bleeding, discharge. G2P1, healthy/normal 1st pregnancy 4 years ago. Having daily nausea without vomiting and fatigue.  Exam: Well appearing  Transvaginal Ultrasound: Intrauterine gestational sac seen with a yolk sac. A fetal pole seen with FHT. Ovaries appear normal with a CLC seen on right ovary. No free fluid seen. Fetal heart rate 162 bpm. S=D 7 weeks 6 days  1st Trimester Pregnancy Nausea Hypothyroid  Plan: PNV with iron daily. Safe pregnancy behaviors reviewed. Educated to maintain healthy diet and exercise within personal limits. Encouraged Development worker, [facility] to set up appointment.  Unisom with B6 for nausea. TSH pending continue same dosage. Synthroid 112.

## 2014-07-14 LAB — TSH: TSH: 6.068 u[IU]/mL — AB (ref 0.350–4.500)

## 2014-07-26 LAB — OB RESULTS CONSOLE HEPATITIS B SURFACE ANTIGEN: Hepatitis B Surface Ag: NEGATIVE

## 2014-07-26 LAB — OB RESULTS CONSOLE HIV ANTIBODY (ROUTINE TESTING): HIV: NONREACTIVE

## 2014-07-26 LAB — OB RESULTS CONSOLE ANTIBODY SCREEN: Antibody Screen: NEGATIVE

## 2014-07-26 LAB — OB RESULTS CONSOLE RPR: RPR: NONREACTIVE

## 2014-07-26 LAB — OB RESULTS CONSOLE RUBELLA ANTIBODY, IGM: Rubella: UNDETERMINED

## 2014-07-26 LAB — OB RESULTS CONSOLE ABO/RH: RH TYPE: POSITIVE

## 2014-08-03 LAB — OB RESULTS CONSOLE GC/CHLAMYDIA
Chlamydia: NEGATIVE
Gonorrhea: NEGATIVE

## 2015-01-18 ENCOUNTER — Inpatient Hospital Stay (HOSPITAL_COMMUNITY): Payer: BC Managed Care – PPO | Admitting: Anesthesiology

## 2015-01-18 ENCOUNTER — Inpatient Hospital Stay (HOSPITAL_COMMUNITY)
Admission: AD | Admit: 2015-01-18 | Discharge: 2015-01-20 | DRG: 775 | Disposition: A | Discharge status: Home / Self Care | Payer: BC Managed Care – PPO | Source: Ambulatory Visit | Attending: Obstetrics | Admitting: Obstetrics

## 2015-01-18 ENCOUNTER — Encounter (HOSPITAL_COMMUNITY): Payer: Self-pay

## 2015-01-18 DIAGNOSIS — Z8249 Family history of ischemic heart disease and other diseases of the circulatory system: Secondary | ICD-10-CM | POA: Diagnosis not present

## 2015-01-18 DIAGNOSIS — Z3A34 34 weeks gestation of pregnancy: Secondary | ICD-10-CM | POA: Diagnosis present

## 2015-01-18 DIAGNOSIS — F329 Major depressive disorder, single episode, unspecified: Secondary | ICD-10-CM | POA: Diagnosis present

## 2015-01-18 DIAGNOSIS — E890 Postprocedural hypothyroidism: Secondary | ICD-10-CM | POA: Diagnosis present

## 2015-01-18 DIAGNOSIS — O42013 Preterm premature rupture of membranes, onset of labor within 24 hours of rupture, third trimester: Principal | ICD-10-CM | POA: Diagnosis present

## 2015-01-18 DIAGNOSIS — J45909 Unspecified asthma, uncomplicated: Secondary | ICD-10-CM | POA: Diagnosis present

## 2015-01-18 DIAGNOSIS — O99343 Other mental disorders complicating pregnancy, third trimester: Secondary | ICD-10-CM | POA: Diagnosis present

## 2015-01-18 DIAGNOSIS — F419 Anxiety disorder, unspecified: Secondary | ICD-10-CM | POA: Diagnosis present

## 2015-01-18 DIAGNOSIS — O42913 Preterm premature rupture of membranes, unspecified as to length of time between rupture and onset of labor, third trimester: Secondary | ICD-10-CM

## 2015-01-18 DIAGNOSIS — O99283 Endocrine, nutritional and metabolic diseases complicating pregnancy, third trimester: Secondary | ICD-10-CM | POA: Diagnosis present

## 2015-01-18 DIAGNOSIS — O99513 Diseases of the respiratory system complicating pregnancy, third trimester: Secondary | ICD-10-CM | POA: Diagnosis present

## 2015-01-18 DIAGNOSIS — Y842 Radiological procedure and radiotherapy as the cause of abnormal reaction of the patient, or of later complication, without mention of misadventure at the time of the procedure: Secondary | ICD-10-CM | POA: Diagnosis present

## 2015-01-18 DIAGNOSIS — O99013 Anemia complicating pregnancy, third trimester: Secondary | ICD-10-CM | POA: Diagnosis present

## 2015-01-18 HISTORY — DX: Anemia, unspecified: D64.9

## 2015-01-18 HISTORY — DX: Preterm premature rupture of membranes, unspecified as to length of time between rupture and onset of labor, third trimester: O42.913

## 2015-01-18 LAB — POCT FERN TEST

## 2015-01-18 LAB — CBC
HEMATOCRIT: 35.7 % — AB (ref 36.0–46.0)
Hemoglobin: 11.7 g/dL — ABNORMAL LOW (ref 12.0–15.0)
MCH: 27 pg (ref 26.0–34.0)
MCHC: 32.8 g/dL (ref 30.0–36.0)
MCV: 82.3 fL (ref 78.0–100.0)
PLATELETS: 195 10*3/uL (ref 150–400)
RBC: 4.34 MIL/uL (ref 3.87–5.11)
RDW: 14.9 % (ref 11.5–15.5)
WBC: 18.2 10*3/uL — ABNORMAL HIGH (ref 4.0–10.5)

## 2015-01-18 LAB — GROUP B STREP BY PCR: Group B strep by PCR: NEGATIVE

## 2015-01-18 MED ORDER — TERBUTALINE SULFATE 1 MG/ML IJ SOLN: 0.2500 mg | Freq: Once | INTRAMUSCULAR | Status: DC | PRN

## 2015-01-18 MED ORDER — ONDANSETRON HCL 4 MG PO TABS: 4.0000 mg | ORAL_TABLET | ORAL | Status: DC | PRN

## 2015-01-18 MED ORDER — OXYCODONE-ACETAMINOPHEN 5-325 MG PO TABS: 2.0000 | ORAL_TABLET | ORAL | Status: DC | PRN

## 2015-01-18 MED ORDER — SENNOSIDES-DOCUSATE SODIUM 8.6-50 MG PO TABS
2.0000 | ORAL_TABLET | ORAL | Status: DC
  Administered 2015-01-18 – 2015-01-19 (×2): 2 via ORAL
  Filled 2015-01-18 (×2): qty 2

## 2015-01-18 MED ORDER — DIPHENHYDRAMINE HCL 25 MG PO CAPS: 25.0000 mg | ORAL_CAPSULE | Freq: Four times a day (QID) | ORAL | Status: DC | PRN

## 2015-01-18 MED ORDER — VANCOMYCIN HCL IN DEXTROSE 1-5 GM/200ML-% IV SOLN
1000.0000 mg | Freq: Two times a day (BID) | INTRAVENOUS | Status: DC
  Administered 2015-01-18: 1000 mg via INTRAVENOUS
  Filled 2015-01-18 (×2): qty 200

## 2015-01-18 MED ORDER — DIBUCAINE 1 % RE OINT: 1.0000 "application " | TOPICAL_OINTMENT | RECTAL | Status: DC | PRN

## 2015-01-18 MED ORDER — BISACODYL 10 MG RE SUPP: 10.0000 mg | Freq: Every day | RECTAL | Status: DC | PRN

## 2015-01-18 MED ORDER — ONDANSETRON HCL 4 MG/2ML IJ SOLN: 4.0000 mg | INTRAMUSCULAR | Status: DC | PRN

## 2015-01-18 MED ORDER — FENTANYL 2.5 MCG/ML BUPIVACAINE 1/10 % EPIDURAL INFUSION (WH - ANES)
14.0000 mL/h | INTRAMUSCULAR | Status: DC | PRN
  Administered 2015-01-18: 14 mL/h via EPIDURAL
  Filled 2015-01-18: qty 125

## 2015-01-18 MED ORDER — IBUPROFEN 600 MG PO TABS
600.0000 mg | ORAL_TABLET | Freq: Four times a day (QID) | ORAL | Status: DC
  Administered 2015-01-18 – 2015-01-20 (×8): 600 mg via ORAL
  Filled 2015-01-18 (×8): qty 1

## 2015-01-18 MED ORDER — OXYTOCIN BOLUS FROM INFUSION: 500.0000 mL | INTRAVENOUS | Status: DC

## 2015-01-18 MED ORDER — LEVOTHYROXINE SODIUM 137 MCG PO TABS
137.0000 ug | ORAL_TABLET | ORAL | Status: DC
  Administered 2015-01-20: 137 ug via ORAL
  Filled 2015-01-18: qty 1

## 2015-01-18 MED ORDER — CITRIC ACID-SODIUM CITRATE 334-500 MG/5ML PO SOLN: 30.0000 mL | ORAL | Status: DC | PRN

## 2015-01-18 MED ORDER — WITCH HAZEL-GLYCERIN EX PADS: 1.0000 "application " | MEDICATED_PAD | CUTANEOUS | Status: DC | PRN

## 2015-01-18 MED ORDER — SIMETHICONE 80 MG PO CHEW: 80.0000 mg | CHEWABLE_TABLET | ORAL | Status: DC | PRN

## 2015-01-18 MED ORDER — PRENATAL MULTIVITAMIN CH
1.0000 | ORAL_TABLET | Freq: Every day | ORAL | Status: DC
  Administered 2015-01-19 – 2015-01-20 (×2): 1 via ORAL
  Filled 2015-01-18 (×2): qty 1

## 2015-01-18 MED ORDER — PHENYLEPHRINE 40 MCG/ML (10ML) SYRINGE FOR IV PUSH (FOR BLOOD PRESSURE SUPPORT)
80.0000 ug | PREFILLED_SYRINGE | INTRAVENOUS | Status: DC | PRN
  Filled 2015-01-18: qty 2
  Filled 2015-01-18: qty 20

## 2015-01-18 MED ORDER — OXYTOCIN 40 UNITS IN LACTATED RINGERS INFUSION - SIMPLE MED
1.0000 m[IU]/min | INTRAVENOUS | Status: DC
  Administered 2015-01-18: 1 m[IU]/min via INTRAVENOUS
  Filled 2015-01-18: qty 1000

## 2015-01-18 MED ORDER — OXYCODONE-ACETAMINOPHEN 5-325 MG PO TABS
1.0000 | ORAL_TABLET | ORAL | Status: DC | PRN
  Administered 2015-01-18 – 2015-01-19 (×5): 1 via ORAL
  Filled 2015-01-18 (×6): qty 1

## 2015-01-18 MED ORDER — EPHEDRINE 5 MG/ML INJ
10.0000 mg | INTRAVENOUS | Status: DC | PRN
  Filled 2015-01-18: qty 2

## 2015-01-18 MED ORDER — OXYTOCIN 40 UNITS IN LACTATED RINGERS INFUSION - SIMPLE MED
62.5000 mL/h | INTRAVENOUS | Status: DC
  Administered 2015-01-18: 62.5 mL/h via INTRAVENOUS

## 2015-01-18 MED ORDER — LACTATED RINGERS IV SOLN: INTRAVENOUS | Status: DC

## 2015-01-18 MED ORDER — SERTRALINE HCL 50 MG PO TABS
50.0000 mg | ORAL_TABLET | Freq: Every day | ORAL | Status: DC
  Administered 2015-01-18 – 2015-01-20 (×3): 50 mg via ORAL
  Filled 2015-01-18 (×4): qty 1

## 2015-01-18 MED ORDER — FLEET ENEMA 7-19 GM/118ML RE ENEM: 1.0000 | ENEMA | Freq: Every day | RECTAL | Status: DC | PRN

## 2015-01-18 MED ORDER — OXYCODONE-ACETAMINOPHEN 5-325 MG PO TABS: 1.0000 | ORAL_TABLET | ORAL | Status: DC | PRN

## 2015-01-18 MED ORDER — LIDOCAINE HCL (PF) 1 % IJ SOLN
INTRAMUSCULAR | Status: DC | PRN
  Administered 2015-01-18: 6 mL

## 2015-01-18 MED ORDER — ONDANSETRON HCL 4 MG/2ML IJ SOLN: 4.0000 mg | Freq: Four times a day (QID) | INTRAMUSCULAR | Status: DC | PRN

## 2015-01-18 MED ORDER — OXYTOCIN 10 UNIT/ML IJ SOLN
10.0000 [IU] | Freq: Once | INTRAMUSCULAR | Status: DC | PRN
Start: 2015-01-18 — End: 2015-01-18
  Filled 2015-01-18: qty 1

## 2015-01-18 MED ORDER — LANOLIN HYDROUS EX OINT
TOPICAL_OINTMENT | CUTANEOUS | Status: DC | PRN
  Administered 2015-01-19: 16:00:00 via TOPICAL

## 2015-01-18 MED ORDER — LEVOTHYROXINE SODIUM 150 MCG PO TABS
150.0000 ug | ORAL_TABLET | ORAL | Status: DC
  Administered 2015-01-19: 150 ug via ORAL
  Filled 2015-01-18 (×2): qty 1

## 2015-01-18 MED ORDER — LACTATED RINGERS IV SOLN: 500.0000 mL | INTRAVENOUS | Status: DC | PRN

## 2015-01-18 MED ORDER — BENZOCAINE-MENTHOL 20-0.5 % EX AERO
1.0000 "application " | INHALATION_SPRAY | CUTANEOUS | Status: DC | PRN
  Administered 2015-01-18: 1 via TOPICAL
  Filled 2015-01-18 (×2): qty 56

## 2015-01-18 MED ORDER — ACETAMINOPHEN 325 MG PO TABS: 650.0000 mg | ORAL_TABLET | ORAL | Status: DC | PRN

## 2015-01-18 MED ORDER — DIPHENHYDRAMINE HCL 50 MG/ML IJ SOLN: 12.5000 mg | INTRAMUSCULAR | Status: DC | PRN

## 2015-01-18 MED ORDER — FENTANYL 2.5 MCG/ML BUPIVACAINE 1/10 % EPIDURAL INFUSION (WH - ANES): 14.0000 mL/h | INTRAMUSCULAR | Status: DC | PRN

## 2015-01-18 MED ORDER — TETANUS-DIPHTH-ACELL PERTUSSIS 5-2.5-18.5 LF-MCG/0.5 IM SUSP: 0.5000 mL | Freq: Once | INTRAMUSCULAR | Status: DC

## 2015-01-18 MED ORDER — ZOLPIDEM TARTRATE 5 MG PO TABS: 5.0000 mg | ORAL_TABLET | Freq: Every evening | ORAL | Status: DC | PRN

## 2015-01-18 MED ORDER — LIDOCAINE HCL (PF) 1 % IJ SOLN
30.0000 mL | INTRAMUSCULAR | Status: DC | PRN
  Filled 2015-01-18: qty 30

## 2015-01-18 NOTE — MAU Provider Note (Signed)
History     CSN: [id number]  Arrival date and time: 01/18/15 6578  Provider contacted: 0820 Provider on unit: 0822 Provider at bedside: [phone]     Chief Complaint  Patient presents with  . rule out ruptured membranes    HPI  Samantha Koch is a 34 yo G2P1001 at 34.6 wks presenting with complaints of rupture of membranes at 0630.  She reports she was lying in bed and felt wetness start.  She has continued to leak a pink tinged clear fluid since then.  When she stood up once arriving to hospital, she had a large gush of the same fluid.  She denies VB; except for the scant pink tinge to the vaginal fluid.  She reports no FM since ROM, but (+) FM since arriving at hospital.  Her GBS status is unknown due to gestation.  Her prenatal care has been complicated by: surgical hypothyroidism, asthma and anemia. Dr. Ernestina Penna is her primary OB provider at [facility].   Past Medical History  Diagnosis Date  . Asthma   . Rubella immune 02/18/07    POS RUBELLA TITER  . Thyroid disease     hyperthyroidism post-pardum/ had radioactive therapy now hypothyroid Spring 2014  . Anemia   . Preterm premature rupture of membranes in third trimester 01/18/2015    History reviewed. No pertinent past surgical history.  Family History  Problem Relation Age of Onset  . Thyroid disease Mother     hypothyroidism  . Hypertension Father   . Hypertension Maternal Grandmother   . Hypertension Maternal Grandfather   . Hypertension Paternal Grandmother   . Hypertension Paternal Grandfather     History  Substance Use Topics  . Smoking status: Never Smoker   . Smokeless tobacco: Never Used  . Alcohol Use: No     Comment: rarely    Allergies:  Allergies  Allergen Reactions  . Penicillins     Prescriptions prior to admission  Medication Sig Dispense Refill Last Dose  . ALBUTEROL IN Inhale into the lungs as needed.    Taking  . cetirizine (ZYRTEC) 10 MG tablet Take 10 mg by mouth as needed.    Taking  .  fluticasone (FLONASE) 50 MCG/ACT nasal spray Place 2 sprays into the nose daily. 16 g 5 Taking  . levothyroxine (SYNTHROID, LEVOTHROID) 112 MCG tablet Take 112 mcg by mouth daily before breakfast.   Taking  . Prenatal Vit-Fe Fumarate-FA (PRENATAL PO) Take 1 tablet by mouth daily.   Taking    Review of Systems  Constitutional: Negative.   HENT: Negative.   Eyes: Negative.   Respiratory: Negative.   Cardiovascular: Negative.   Gastrointestinal: Negative.   Genitourinary:       (+) leaking pink tinged fluid; (+) FM; recent tx for UTI  Musculoskeletal: Negative.   Skin: Negative.   Neurological: Negative.   Endo/Heme/Allergies: Negative.   Psychiatric/Behavioral: Negative.    CEFM FHR: 145 bpm / moderate variability / accels present / no decels TOCO: regular every 5 mins  Results for orders placed or performed during the hospital encounter of 01/18/15 (from the past 24 hour(s))  POCT fern test     Status: Abnormal   Collection Time: 01/18/15  8:04 AM  Result Value Ref Range   POCT Fern Test POSITIVE   Group B strep by PCR     Status: None   Collection Time: 01/18/15  8:55 AM  Result Value Ref Range   Group B strep by PCR NEGATIVE NEGATIVE  CBC     Status: Abnormal   Collection Time: 01/18/15  9:25 AM  Result Value Ref Range   WBC 18.2 (H) 4.0 - 10.5 K/uL   RBC 4.34 3.87 - 5.11 MIL/uL   Hemoglobin 11.7 (L) 12.0 - 15.0 g/dL   HCT 16.135.7 (L) 09.636.0 - 04.546.0 %   MCV 82.3 78.0 - 100.0 fL   MCH 27.0 26.0 - 34.0 pg   MCHC 32.8 30.0 - 36.0 g/dL   RDW 40.914.9 81.111.5 - 91.415.5 %   Platelets 195 150 - 400 K/uL   Physical Exam   Last menstrual period 05/19/2014.  Physical Exam  Constitutional: She is oriented to person, place, and time. She appears well-developed and well-nourished.  HENT:  Head: Normocephalic and atraumatic.  Eyes: Pupils are equal, round, and reactive to light.  Neck: Normal range of motion.  Cardiovascular: Normal rate, regular rhythm, normal heart sounds and intact  distal pulses.   Respiratory: Effort normal and breath sounds normal.  GI: Soft. Bowel sounds are normal.  Genitourinary: Vaginal discharge found.  Small amount of clear blood tinged fluid; VE: 2.5/70%/-3/vtx  Musculoskeletal: Normal range of motion.  Neurological: She is alert and oriented to person, place, and time. She has normal reflexes.  Skin: Skin is warm and dry.  Psychiatric: She has a normal mood and affect. Her behavior is normal. Judgment and thought content normal.    MAU Course  Procedures CEFM Fern - positive Assessment and Plan  34 yo G2P1001 at 34.[redacted] wks gestation PPROM Hypothyroidism in Pregnancy Asthma Anemia   Admit to L&D for PPROM See admission orders Dr. Seymour BarsLavoie notified of assessment, plan and admission - MD care at time of admission  Raelyn MoraDAWSON, Saloma Cadena, M MSN, CNM 01/18/2015, 8:29 AM

## 2015-01-18 NOTE — Anesthesia Preprocedure Evaluation (Signed)
Anesthesia Evaluation  Patient identified by MRN, date of birth, ID band Patient awake    Reviewed: Allergy & Precautions, H&P , Patient's Chart, lab work & pertinent test results  Airway Mallampati: II  TM Distance: >3 FB Neck ROM: full    Dental  (+) Teeth Intact   Pulmonary asthma ,  breath sounds clear to auscultation        Cardiovascular Rhythm:regular Rate:Normal     Neuro/Psych    GI/Hepatic   Endo/Other  Hypothyroidism   Renal/GU      Musculoskeletal   Abdominal   Peds  Hematology   Anesthesia Other Findings       Reproductive/Obstetrics (+) Pregnancy                             Anesthesia Physical Anesthesia Plan  ASA: II  Anesthesia Plan: Epidural   Post-op Pain Management:    Induction:   Airway Management Planned:   Additional Equipment:   Intra-op Plan:   Post-operative Plan:   Informed Consent: I have reviewed the patients History and Physical, chart, labs and discussed the procedure including the risks, benefits and alternatives for the proposed anesthesia with the patient or authorized representative who has indicated his/her understanding and acceptance.   Dental Advisory Given  Plan Discussed with:   Anesthesia Plan Comments: (Labs checked- platelets confirmed with RN in room. Fetal heart tracing, per RN, reported to be stable enough for sitting procedure. Discussed epidural, and patient consents to the procedure:  included risk of possible headache,backache, failed block, allergic reaction, and nerve injury. This patient was asked if she had any questions or concerns before the procedure started.)        Anesthesia Quick Evaluation

## 2015-01-18 NOTE — Consult Note (Signed)
Neonatology Note:   Attendance at Delivery:    I was asked by Dr. Ernestina PennaFogleman to attend this NSVD at 34 6/7 weeks after SROM this morning. The mother is a G2P1 O pos, GBS neg with hypothyroidism, on Synthroid. ROM 8 hours prior to delivery, fluid clear. Patient got Vancomycin during labor before GBS status was known. She was afebrile. Infant vigorous with good spontaneous cry and tone. Delayed cord clamping was done. Needed only minimal bulb suctioning. Ap 9/9. Lungs clear to ausc in DR, no distress. Held by parents briefly, then transported to the NICU for further care.   Doretha Souhristie C. Shawan Corella, MD

## 2015-01-18 NOTE — MAU Note (Signed)
Pt states slow trickle of fluid since 0630, clear fluid. Continues to trickle out. Mild cramping pain.

## 2015-01-18 NOTE — H&P (Signed)
History     CSN: [id number]  Arrival date and time: 01/18/15 [id number]  Provider contacted: 0820 Provider on unit: 0822 Provider at bedside: [phone]     Chief Complaint  Patient presents with  . rule out ruptured membranes    HPI  Ms. Samantha Koch is a 34 yo G2P1001 at 34.6 wks presenting with complaints of rupture of membranes at 0630.  She reports she was lying in bed and felt wetness start.  She has continued to leak a pink tinged clear fluid since then.  When she stood up once arriving to hospital, she had a large gush of the same fluid.  She denies VB; except for the scant pink tinge to the vaginal fluid.  She reports no FM since ROM, but (+) FM since arriving at hospital.  Her GBS status is unknown due to gestation.  Her prenatal care has been complicated by: surgical hypothyroidism, asthma and anemia. Dr. Ernestina PennaFogleman is her primary OB provider at [facility].   Past Medical History  Diagnosis Date  . Asthma   . Rubella immune 02/18/07    POS RUBELLA TITER  . Thyroid disease     hyperthyroidism post-pardum/ had radioactive therapy now hypothyroid Spring 2014  . Anemia   . Preterm premature rupture of membranes in third trimester 01/18/2015    History reviewed. No pertinent past surgical history.  Family History  Problem Relation Age of Onset  . Thyroid disease Mother     hypothyroidism  . Hypertension Father   . Hypertension Maternal Grandmother   . Hypertension Maternal Grandfather   . Hypertension Paternal Grandmother   . Hypertension Paternal Grandfather     History  Substance Use Topics  . Smoking status: Never Smoker   . Smokeless tobacco: Never Used  . Alcohol Use: No     Comment: rarely    Allergies:  Allergies  Allergen Reactions  . Penicillins     Prescriptions prior to admission  Medication Sig Dispense Refill Last Dose  . ALBUTEROL IN Inhale into the lungs as needed.    Taking  . cetirizine (ZYRTEC) 10 MG tablet Take 10 mg by mouth as needed.    Taking  .  fluticasone (FLONASE) 50 MCG/ACT nasal spray Place 2 sprays into the nose daily. 16 g 5 Taking  . levothyroxine (SYNTHROID, LEVOTHROID) 112 MCG tablet Take 112 mcg by mouth daily before breakfast.   Taking  . Prenatal Vit-Fe Fumarate-FA (PRENATAL PO) Take 1 tablet by mouth daily.   Taking    Review of Systems  Constitutional: Negative.   HENT: Negative.   Eyes: Negative.   Respiratory: Negative.   Cardiovascular: Negative.   Gastrointestinal: Negative.   Genitourinary:       (+) leaking pink tinged fluid; (+) FM; recent tx for UTI  Musculoskeletal: Negative.   Skin: Negative.   Neurological: Negative.   Endo/Heme/Allergies: Negative.   Psychiatric/Behavioral: Negative.    CEFM FHR: 145 bpm / moderate variability / accels present / no decels TOCO: regular every 5 mins  Results for orders placed or performed during the hospital encounter of 01/18/15 (from the past 24 hour(s))  POCT fern test     Status: Abnormal   Collection Time: 01/18/15  8:04 AM  Result Value Ref Range   POCT Fern Test POSITIVE   Group B strep by PCR     Status: None   Collection Time: 01/18/15  8:55 AM  Result Value Ref Range   Group B strep by PCR NEGATIVE NEGATIVE  CBC     Status: Abnormal   Collection Time: 01/18/15  9:25 AM  Result Value Ref Range   WBC 18.2 (H) 4.0 - 10.5 K/uL   RBC 4.34 3.87 - 5.11 MIL/uL   Hemoglobin 11.7 (L) 12.0 - 15.0 g/dL   HCT 16.1 (L) 09.6 - 04.5 %   MCV 82.3 78.0 - 100.0 fL   MCH 27.0 26.0 - 34.0 pg   MCHC 32.8 30.0 - 36.0 g/dL   RDW 40.9 81.1 - 91.4 %   Platelets 195 150 - 400 K/uL   Physical Exam   Last menstrual period 05/19/2014.  Physical Exam  Constitutional: She is oriented to person, place, and time. She appears well-developed and well-nourished.  HENT:  Head: Normocephalic and atraumatic.  Eyes: Pupils are equal, round, and reactive to light.  Neck: Normal range of motion.  Cardiovascular: Normal rate, regular rhythm, normal heart sounds and intact  distal pulses.   Respiratory: Effort normal and breath sounds normal.  GI: Soft. Bowel sounds are normal.  Genitourinary: Vaginal discharge found.  Small amount of clear blood tinged fluid; VE: 2.5/70%/-3/vtx  Musculoskeletal: Normal range of motion.  Neurological: She is alert and oriented to person, place, and time. She has normal reflexes.  Skin: Skin is warm and dry.  Psychiatric: She has a normal mood and affect. Her behavior is normal. Judgment and thought content normal.    MAU Course  Procedures CEFM Fern - positive Assessment and Plan  34 yo G2P1001 at 34.[redacted] wks gestation PPROM Hypothyroidism in Pregnancy Asthma Anemia   Admit to L&D for PPROM Routine admission orders GBS by PCR / Clinda if pos Pitocin Augmentation, if not laboring  **Dr. Seymour Bars notified of assessment, plan and admission - MD care at time of admission  Patient seen and evaluated.  Agree with above note. PROM in Spontaneous labor at 34 6/7 wks Low suspicion of Chorio as etiology of PROM.  Decision to cover with Vanco 1 g IV q12 hrs (All to Pen).  GBS neg. Pitocin Augmentation. FHR monitoring Cat 1 Seen by NICU.  Genia Del MD  01/18/2015 at 10:57 am  Kenard Gower MSN, CNM 01/18/2015, 8:29 AM

## 2015-01-18 NOTE — Consult Note (Signed)
[facility] 01/18/2015    10:58 AM  Neonatal Medicine Consultation         Melida QuitterMarcia A Mazurowski          MRN:  [id number]  I was called at the request of the patient's obstetrician (Dr. Seymour BarsLavoie) to speak to this patient due to potential preterm delivery as early as 34 6/7 weeks due to recent premature rupture of membranes.  The patient's prenatal course has been uncomplicated until today when she rupture her membranes.  She is 34 6/7 weeks currently.  She is admitted to L&D, and is receiving treatment that includes vancomycin and induction of labor.  The baby is female.  I reviewed expectations for a baby born at 34 weeks and beyond, including survival, length of stay, morbidities such as respiratory distress, IVH, infection, feeding intolerance, retinopathy.  I described how we provide respiratory and feeding support.  Mom plans to breast feed, which I encouraged as best for the baby (with supplementations for needed calories).  I let mom know that the baby's outlook generally improves the longer she remains undelivered, however there is increasing risk for infection the longer membranes remained ruptured.  Given the decision to induce labor, and given that this is mom's 2nd baby, I would expect this baby to get delivered today or tomorrow.  I spent 20 minutes reviewing the record, speaking to the patient, and entering appropriate documentation.  More than 50% of the time was spent face to face with patient.   _____________________ Electronically Signed By: Angelita InglesMcCrae S. Smith, MD Neonatologist

## 2015-01-18 NOTE — Progress Notes (Signed)
Notified pt in MAU grossly ruptured, fern +, will come see pt

## 2015-01-18 NOTE — Anesthesia Procedure Notes (Signed)

## 2015-01-18 NOTE — MAU Note (Signed)
Started leaking at 0630, clear/pinkish water. Few spots of blood. Some cramping and downward pressure

## 2015-01-19 LAB — CBC
HEMATOCRIT: 34.1 % — AB (ref 36.0–46.0)
Hemoglobin: 10.9 g/dL — ABNORMAL LOW (ref 12.0–15.0)
MCH: 26.5 pg (ref 26.0–34.0)
MCHC: 32 g/dL (ref 30.0–36.0)
MCV: 83 fL (ref 78.0–100.0)
Platelets: 206 10*3/uL (ref 150–400)
RBC: 4.11 MIL/uL (ref 3.87–5.11)
RDW: 15.1 % (ref 11.5–15.5)
WBC: 17.5 10*3/uL — ABNORMAL HIGH (ref 4.0–10.5)

## 2015-01-19 LAB — RPR: RPR: NONREACTIVE

## 2015-01-19 NOTE — Lactation Note (Signed)
This note was copied from the chart of Samantha Koch Samantha Koch. Lactation Consultation Note  Patient Name: Samantha Koch ZOXWR'UToday's Date: 01/19/2015 Reason for consult: Initial assessment;NICU baby;Infant < 6lbs;Late preterm infant Mom had initially started pumping on standard setting, turning pump strength up and became sore with bruised, red nipple/aerola. Changed flange size to 27 and now using coconut oil for lubrication with pumping and denies discomfort. Care for sore nipples reviewed and Mom has comfort gels. Encouraged to pump every 3 hours for 15 minutes on preemie setting to encourage milk production. Reviewed storage guidelines for NICU, reviewed parameters about what volume to expect with pumping.  Mom has NICU booklet and Lactation brochure for review. Mom to call insurance company about pump, 2 week rental program discussed. Mom has pump used with 1st child and plans to bring this in for pressure check. Encouraged to call for questions/concerns.   Maternal Data Has patient been taught Hand Expression?: Yes Does the patient have breastfeeding experience prior to this delivery?: Yes  Feeding Feeding Type: Breast Milk Length of feed: 10 min  LATCH Score/Interventions             Problem noted: Cracked, bleeding, blisters, bruises;Mild/Moderate discomfort Interventions  (Cracked/bleeding/bruising/blister): Expressed breast milk to nipple Interventions (Mild/moderate discomfort): Comfort gels        Lactation Tools Discussed/Used Tools: Pump;Comfort gels Breast pump type: Double-Electric Breast Pump   Consult Status Consult Status: Follow-up Date: 01/20/15 Follow-up type: In-patient    Samantha Koch, Samantha Koch 01/19/2015, 4:54 PM

## 2015-01-19 NOTE — Anesthesia Postprocedure Evaluation (Signed)
Anesthesia Post Note  Patient: Samantha Koch  Procedure(s) Performed: * No procedures listed *  Anesthesia type: Epidural  Patient location: Mother/Baby  Post pain: Pain level controlled  Post assessment: Post-op Vital signs reviewed  Last Vitals:  Filed Vitals:   01/19/15 0554  BP: 129/82  Pulse: 78  Temp: 36.4 C  Resp: 18    Post vital signs: Reviewed  Level of consciousness:alert  Complications: No apparent anesthesia complications

## 2015-01-19 NOTE — Progress Notes (Signed)
PPD #1- SVD  Subjective:   Reports feeling well, perineum sore Tolerating po/ No nausea or vomiting Bleeding is light Pain controlled with Motrin and Percocet Up ad lib / ambulatory / voiding without problems Newborn: breastpumping/NICU   Objective:   VS:  VS:  Filed Vitals:   01/18/15 1703 01/18/15 1805 01/18/15 2111 01/19/15 0554  BP: 118/69 134/71 113/63 129/82  Pulse: 71 73 66 78  Temp: 99.6 F (37.6 C) 98.9 F (37.2 C) 98.1 F (36.7 C) 97.5 F (36.4 C)  TempSrc: Oral Oral Oral Oral  Resp: 16 16 18 18   Height:      Weight:      SpO2: 100% 100% 100% 100%    LABS:  Recent Labs  01/18/15 0925 01/19/15 0537  WBC 18.2* 17.5*  HGB 11.7* 10.9*  PLT 195 206   Blood type: O/Positive/-- (01/21 0000) Rubella: Equivocal (01/21 0000)   I&O: Intake/Output      07/15 0701 - 07/16 0700 07/16 0701 - 07/17 0700   Urine (mL/kg/hr) 0    Blood 100    Total Output 100     Net -100          Urine Occurrence 1 x      Physical Exam: Alert and oriented x3 Abdomen: soft, non-tender, non-distended  Fundus: firm, non-tender, U-2 Perineum: Well approximated, no significant erythema, edema, or drainage; healing well. Lochia: small Extremities: no edema, no calf pain or tenderness    Assessment:  PPD #1 G2P1/ S/P:spontaneous vaginal, 2nd degree laceration H/o depression Surgical Hypothyroidism  Doing well    Plan: Continue Synthroid Start Zoloft as per discussion b/w pt and Dr. Ernestina PennaFogleman Continue routine post partum orders Anticipate D/C home tomorrow Follow-up at [facility] in 2 wks for mood and medication eval   Dailan Pfalzgraf, N MSN, CNM 01/19/2015, 11:38 AM

## 2015-01-20 MED ORDER — OXYCODONE-ACETAMINOPHEN 5-325 MG PO TABS: 1.0000 | ORAL_TABLET | ORAL | Status: DC | PRN

## 2015-01-20 MED ORDER — IBUPROFEN 600 MG PO TABS: 600.0000 mg | ORAL_TABLET | Freq: Four times a day (QID) | ORAL | Status: DC

## 2015-01-20 MED ORDER — SERTRALINE HCL 50 MG PO TABS: 50.0000 mg | ORAL_TABLET | Freq: Every day | ORAL | Status: DC

## 2015-01-20 NOTE — Progress Notes (Signed)
PPD #2- SVD  Subjective:   Reports feeling well, perineum less sore Tolerating po/ No nausea or vomiting Bleeding is light Pain controlled with Motrin and Percocet Up ad lib / ambulatory / voiding without problems Newborn: breastpumping/NICU   Objective:   VS: VS:  Filed Vitals:   01/19/15 1220 01/19/15 1705 01/19/15 2200 01/20/15 0446  BP: 113/57 125/69 130/74 125/80  Pulse: 69 69 61 55  Temp: 97.8 F (36.6 C) 97.7 F (36.5 C) 98.3 F (36.8 C) 97.7 F (36.5 C)  TempSrc: Oral Oral Oral Oral  Resp: 16 16 18 20   Height:      Weight:      SpO2: 100% 100% 100% 100%    LABS:  Recent Labs  01/18/15 0925 01/19/15 0537  WBC 18.2* 17.5*  HGB 11.7* 10.9*  PLT 195 206   Blood type: O/Positive/-- (01/21 0000) Rubella: Equivocal (01/21 0000)                I&O: Intake/Output      07/16 0701 - 07/17 0700 07/17 0701 - 07/18 0700   Urine (mL/kg/hr)     Blood     Total Output       Net              Physical Exam: Alert and oriented X3 Abdomen: soft, non-tender, non-distended  Fundus: firm, non-tender, U-2 Perineum: Well approximated, no significant erythema, edema, or drainage; healing well. Lochia: small Extremities: trace BLE edema, no calf pain or tenderness    Assessment: PPD #2  G2P1/ S/P:spontaneous vaginal at 35 wks, 2nd degree laceration H/o depression Surgical Hypothyroidism  Doing well - stable for discharge home   Plan: Discharge home RX's:  Ibuprofen 600mg  po Q 6 hrs prn pain #30 Refill x 0 Zoloft 50 mg po daily #30, refill x1 Percocet 5/325 1 to 2 po Q 4 hrs prn pain #30 Refill x 0 Continue Synthroid (has Rx) Follow-[facility] wks at [facility] for mood/med evaluation [facility] given    Donette LarryBHAMBRI, Bilal Manzer, N MSN, CNM 01/20/2015, 11:23 AM

## 2015-01-20 NOTE — Progress Notes (Addendum)
Discharge teaching complete. Pt understood all instructions and did not have any questions. Pt ambulated out of the hospital and discharged home to family.  

## 2015-01-20 NOTE — Discharge Summary (Signed)
Obstetric Discharge Summary Reason for Admission: PPROM @34 .6 wks Prenatal Procedures: asthma, anemia, surgical hypothyroidism, remore hx of depression and anxiety Intrapartum Procedures: spontaneous vaginal delivery and Pitcoin augmentation, epidural Postpartum Procedures: none Complications-Operative and Postpartum: 2nd degree perineal laceration HEMOGLOBIN  Date Value Ref Range Status  01/19/2015 10.9* 12.0 - 15.0 g/dL Final   HCT  Date Value Ref Range Status  01/19/2015 34.1* 36.0 - 46.0 % Final    Physical Exam:  General: alert, cooperative and no distress Lochia: appropriate Uterine Fundus: firm Incision: healing well, no significant drainage, no dehiscence, no significant erythema DVT Evaluation: No evidence of DVT seen on physical exam. Negative Homan's sign. No cords or calf tenderness. No significant calf/ankle edema.  Discharge Diagnoses: Preterm pregnancy-delivered  Discharge Information: Date: 01/20/2015 Activity: pelvic rest Diet: routine Medications: PNV, Ibuprofen, Percocet and Synthroid, Zoloft Condition: stable Instructions: refer to practice specific booklet Discharge to: home Follow-up Information    [facility]. Schedule an appointment as soon as possible for a visit in 2 weeks.   Specialty:  Obstetrics and Gynecology   Why:  evaluation of medication and mood   Contact information:   [address] [place] [id number] [phone]       Follow up with Noland FordyceFOGLEMAN,KELLY A., MD. Schedule an appointment as soon as possible for a visit in 6 weeks.   Specialty:  Obstetrics and Gynecology   Contact information:   [address] Severe[address] [place] [id number] [phone]       Newborn Data: Live born female  Birth Weight: 5 lb 12.4 oz (2620 g) APGAR: 9, 9 NICU  Patryce Depriest, N 01/20/2015, 11:32 AM

## 2015-01-21 ENCOUNTER — Ambulatory Visit: Payer: Self-pay

## 2015-01-21 NOTE — Lactation Note (Signed)
This note was copied from the chart of Samantha Rayfield CitizenMarcia Common. Lactation Consultation Note  Patient Name: Samantha Koch EAVWU'JToday's Koch: 01/21/2015 Reason for consult: Follow-up assessment  With this mom of a NICU baby, now 5966 hours old, and 35 2/7 weeks CGA. Mom was doing STS with her baby in the NICU when I spoke to her. She was given 2 DEP membranes, since her other membranes were" lost down the drain". Mom reports an increase in her amount expressed with hand expression. She also latched her baby and reports her sucking well and staying latached for "a while". Mom had no other concerns at this time, but knows to call for lactation as needed.    Maternal Data    Feeding Feeding Type: Breast Milk with Formula added Length of feed: 30 min  LATCH Score/Interventions                      Lactation Tools Discussed/Used     Consult Status Consult Status: PRN Follow-up type: In-patient (NICU)    Alfred LevinsLee, Tawsha Terrero Anne 01/21/2015, 10:52 AM

## 2015-01-22 ENCOUNTER — Ambulatory Visit: Payer: Self-pay

## 2015-01-22 NOTE — Lactation Note (Signed)
This note was copied from the chart of Samantha Koch [place]. Lactation Consultation Note  Spoke to mom and RN at infant's bedside.  RN states baby just completed a good feeding at breast.  She feels baby transferred a good amount of milk.  Mom is pumping 10-15 mls.  Encouraged to continue pumping every 3 hours and put baby to breast as much as possible.  Patient Name: Samantha Koch Cl[place] VWUJW'JToday's Date: 01/22/2015     Maternal Data    Feeding    LATCH Score/Interventions                      Lactation Tools Discussed/Used     Consult Status      Huston FoleyMOULDEN, Deetya Drouillard S 01/22/2015, 3:52 PM

## 2015-01-24 ENCOUNTER — Ambulatory Visit: Payer: Self-pay

## 2015-01-24 NOTE — Lactation Note (Signed)
This note was copied from the chart of Samantha Lyndia Bury. Lactation Consultation Note  NICU RN called to answer mom's questions about pumping.  She is currently breastfeeding baby.  Baby latched well and nursing actively.  Reviewed with mom expectations for late preterm feeding norms.  Explained that even though baby appears to be latching/feeding well most late preterm babies don't transfer milk consistently.  Stressed importance of post pumping the next 2-3 weeks to maintain good milk supply.  Mom currently pumping 40 mls.  Recommended a lactation outpatient appointment about a week after discharge.  Patient Name: Samantha Koch ZOXWR'U Date: 01/24/2015     Maternal Data    Feeding Feeding Type: Breast Fed  LATCH Score/Interventions Latch: Grasps breast easily, tongue down, lips flanged, rhythmical sucking.  Audible Swallowing: Spontaneous and intermittent  Type of Nipple: Everted at rest and after stimulation  Comfort (Breast/Nipple): Soft / non-tender     Hold (Positioning): No assistance needed to correctly position infant at breast.  LATCH Score: 10  Lactation Tools Discussed/Used     Consult Status      Samantha Koch 01/24/2015, 1:32 PM

## 2015-01-25 ENCOUNTER — Ambulatory Visit: Payer: Self-pay

## 2015-01-25 NOTE — Lactation Note (Signed)
This note was copied from the chart of Samantha Evani Shrider. Lactation Consultation Note  Observed baby at breast with good latch and active suckling.  Mom is concerned about possible blisters on right nipple.  Nipple examined and a few milk blebs noted.  Explained to mom these are plugged milk pores and not blisters from latch or pumping.  Instructed to soak breast in warm water and gently rub a washcloth over area to loosen prior to pumping or latching.  If unable to soak in water she can use warm wet washcloth.  If blebs don't resolve instructed to call.  Patient Name: Samantha Koch ZOXWR'U Date: 01/25/2015     Maternal Data    Feeding Feeding Type: Breast Fed Length of feed: 30 min  LATCH Score/Interventions Latch: Grasps breast easily, tongue down, lips flanged, rhythmical sucking.  Audible Swallowing: Spontaneous and intermittent  Type of Nipple: Everted at rest and after stimulation  Comfort (Breast/Nipple): Filling, red/small blisters or bruises, mild/mod discomfort     Hold (Positioning): No assistance needed to correctly position infant at breast.  LATCH Score: 9  Lactation Tools Discussed/Used     Consult Status      Huston Foley 01/25/2015, 4:16 PM

## 2015-02-04 ENCOUNTER — Encounter (HOSPITAL_COMMUNITY): Payer: Self-pay | Admitting: *Deleted

## 2015-08-18 ENCOUNTER — Telehealth: Payer: Self-pay

## 2015-08-18 ENCOUNTER — Ambulatory Visit (INDEPENDENT_AMBULATORY_CARE_PROVIDER_SITE_OTHER): Payer: BC Managed Care – PPO | Admitting: Family Medicine

## 2015-08-18 VITALS — BP 134/80 | HR 91 | Temp 100.0°F | Resp 20 | Ht 65.0 in | Wt 176.8 lb

## 2015-08-18 DIAGNOSIS — R05 Cough: Secondary | ICD-10-CM | POA: Diagnosis not present

## 2015-08-18 DIAGNOSIS — J0101 Acute recurrent maxillary sinusitis: Secondary | ICD-10-CM

## 2015-08-18 DIAGNOSIS — R509 Fever, unspecified: Secondary | ICD-10-CM | POA: Diagnosis not present

## 2015-08-18 DIAGNOSIS — Z789 Other specified health status: Secondary | ICD-10-CM

## 2015-08-18 DIAGNOSIS — N6459 Other signs and symptoms in breast: Secondary | ICD-10-CM

## 2015-08-18 DIAGNOSIS — R059 Cough, unspecified: Secondary | ICD-10-CM

## 2015-08-18 LAB — POCT INFLUENZA A/B
Influenza A, POC: NEGATIVE
Influenza B, POC: NEGATIVE

## 2015-08-18 LAB — POCT CBC
Granulocyte percent: 85.3 %G — AB (ref 37–80)
HCT, POC: 40.3 % (ref 37.7–47.9)
Hemoglobin: 13.4 g/dL (ref 12.2–16.2)
Lymph, poc: 1.1 (ref 0.6–3.4)
MCH: 25.7 pg — AB (ref 27–31.2)
MCHC: 33.1 g/dL (ref 31.8–35.4)
MCV: 77.4 fL — AB (ref 80–97)
MID (cbc): 0.4 (ref 0–0.9)
MPV: 6.8 fL (ref 0–99.8)
POC Granulocyte: 8.8 — AB (ref 2–6.9)
POC LYMPH %: 10.6 % (ref 10–50)
POC MID %: 4.1 %M (ref 0–12)
Platelet Count, POC: 279 10*3/uL (ref 142–424)
RBC: 5.21 M/uL (ref 4.04–5.48)
RDW, POC: 14.7 %
WBC: 10.3 10*3/uL — AB (ref 4.6–10.2)

## 2015-08-18 MED ORDER — AZITHROMYCIN 250 MG PO TABS: ORAL_TABLET | ORAL | Status: DC

## 2015-08-18 NOTE — Telephone Encounter (Signed)
Patient was just seen and states she was told that someone would call her right away with the flu test results. Patient states that it's been an hour and she hasn't heard anything. [phone]

## 2015-08-18 NOTE — Progress Notes (Signed)
Subjective:    Patient ID: Samantha Koch, female    DOB: [birth year], 35 y.o.   MRN: [id number]  08/18/2015  Sinusitis; Fever; Cough; and Shortness of Breath   HPI This 35 y.o. female presents for evaluation of cold symptoms.  Getting on a plane to [place].  +fever onset yesterday; Tmax 101.5; Advil.  Building sinus pressure all week.  +HA.  Ears are congested; no pain.  +ST mild.  +PND. +nasal congestion yellow thick; +coughing; husband and older daughter with cough all week.  Breastfeeding; 49 month old.  Essential oil products for clearing.  No n/v/d.    L breast pain this morning; blocked duct sensation.   Feeding every 3 hours.  Just started this morning at 5:00am; had to pump this morning due to severity.  Had constant issue with nipple blister.     Review of Systems  Constitutional: Positive for fever and fatigue. Negative for chills and diaphoresis.  HENT: Positive for congestion, postnasal drip, rhinorrhea, sinus pressure, sore throat and voice change. Negative for ear pain and trouble swallowing.   Respiratory: Positive for cough. Negative for shortness of breath and wheezing.   Cardiovascular: Negative for chest pain, palpitations and leg swelling.  Gastrointestinal: Negative for nausea, vomiting, abdominal pain, diarrhea and constipation.    Past Medical History  Diagnosis Date  . Asthma   . Rubella immune 02/18/07    POS RUBELLA TITER  . Thyroid disease     hyperthyroidism post-pardum/ had radioactive therapy now hypothyroid Spring 2014  . Anemia   . Preterm premature rupture of membranes in third trimester 01/18/2015  . Postpartum care following vaginal delivery (7/15) 01/18/2015   History reviewed. No pertinent past surgical history. Allergies  Allergen Reactions  . Penicillins Anaphylaxis and Rash    Lips swell    Social History   Social History  . Marital Status: Married    Spouse Name: N/A  . Number of Children: N/A  . Years of Education: N/A    Occupational History  . Not on file.   Social History Main Topics  . Smoking status: Never Smoker   . Smokeless tobacco: Never Used  . Alcohol Use: No     Comment: rarely  . Drug Use: No  . Sexual Activity:    Partners: Male    Birth Control/ Protection: Condom   Other Topics Concern  . Not on file   Social History Narrative   Family History  Problem Relation Age of Onset  . Thyroid disease Mother     hypothyroidism  . Hypertension Father   . Hypertension Maternal Grandmother   . Hypertension Maternal Grandfather   . Hypertension Paternal Grandmother   . Hypertension Paternal Grandfather        Objective:    BP 134/80 mmHg  Pulse 91  Temp(Src) 100 F (37.8 C) (Oral)  Resp 20  Ht  (1.651 m)  Wt 176 lb 12.8 oz (80.196 kg)  BMI 29.42 kg/m2  SpO2 97%  Breastfeeding? Yes Physical Exam  Constitutional: She is oriented to person, place, and time. She appears well-developed and well-nourished. No distress.  HENT:  Head: Normocephalic and atraumatic.  Right Ear: Tympanic membrane, external ear and ear canal normal.  Left Ear: Tympanic membrane, external ear and ear canal normal.  Nose: Mucosal edema and rhinorrhea present. Right sinus exhibits maxillary sinus tenderness. Right sinus exhibits no frontal sinus tenderness. Left sinus exhibits maxillary sinus tenderness. Left sinus exhibits no frontal sinus tenderness.  Mouth/Throat: Uvula  is midline, oropharynx is clear and moist and mucous membranes are normal.  Eyes: Conjunctivae are normal. Pupils are equal, round, and reactive to light.  Neck: Normal range of motion. Neck supple.  Cardiovascular: Normal rate, regular rhythm and normal heart sounds.  Exam reveals no gallop and no friction rub.   No murmur heard. Pulmonary/Chest: Effort normal and breath sounds normal. She has no wheezes. She has no rales. Left breast exhibits tenderness. Left breast exhibits no inverted nipple, no mass, no nipple discharge and no  skin change. Breasts are symmetrical.  L lateral breast with mild TTP but no induration or erythema.  +palplable duct.  Neurological: She is alert and oriented to person, place, and time.  Skin: She is not diaphoretic.  Psychiatric: She has a normal mood and affect. Her behavior is normal.  Nursing note and vitals reviewed.  Results for orders placed or performed in visit on 08/18/15  POCT Influenza A/B  Result Value Ref Range   Influenza A, POC Negative Negative   Influenza B, POC Negative Negative  POCT CBC  Result Value Ref Range   WBC 10.3 (A) 4.6 - 10.2 K/uL   Lymph, poc 1.1 0.6 - 3.4   POC LYMPH PERCENT 10.6 10 - 50 %L   MID (cbc) 0.4 0 - 0.9   POC MID % 4.1 0 - 12 %M   POC Granulocyte 8.8 (A) 2 - 6.9   Granulocyte percent 85.3 (A) 37 - 80 %G   RBC 5.21 4.04 - 5.48 M/uL   Hemoglobin 13.4 12.2 - 16.2 g/dL   HCT, POC 16.1 09.6 - 47.9 %   MCV 77.4 (A) 80 - 97 fL   MCH, POC 25.7 (A) 27 - 31.2 pg   MCHC 33.1 31.8 - 35.4 g/dL   RDW, POC 04.5 %   Platelet Count, POC 279 142 - 424 K/uL   MPV 6.8 0 - 99.8 fL       Assessment & Plan:   1. Acute recurrent maxillary sinusitis   2. Breast engorgement   3. Fever, unspecified   4. Cough   5. Breastfeeding (infant)     Orders Placed This Encounter  Procedures  . POCT Influenza A/B  . POCT CBC   Meds ordered this encounter  Medications  . azithromycin (ZITHROMAX) 250 MG tablet    Sig: Two tablets daily x 5 days    Dispense:  10 tablet    Refill:  0    No Follow-up on file.  Jyrah Blye Paulita Fujita, M.D. [facility] [address] [place], [place]  [address] [phone] phone [phone] fax

## 2015-08-18 NOTE — Patient Instructions (Signed)
1.  Continue Netti pot once daily; also recommend nasal saline solution/spray as needed during the day. 2. Continue Flonase daily.

## 2015-08-19 NOTE — Telephone Encounter (Signed)
Advised patient of results on 2/12 at 12:00pm.

## 2016-02-05 ENCOUNTER — Ambulatory Visit (INDEPENDENT_AMBULATORY_CARE_PROVIDER_SITE_OTHER): Payer: BC Managed Care – PPO | Admitting: Women's Health

## 2016-02-05 ENCOUNTER — Encounter: Payer: Self-pay | Admitting: Women's Health

## 2016-02-05 VITALS — BP 119/79 | Ht 65.0 in | Wt 179.4 lb

## 2016-02-05 DIAGNOSIS — Z01419 Encounter for gynecological examination (general) (routine) without abnormal findings: Secondary | ICD-10-CM | POA: Diagnosis not present

## 2016-02-05 LAB — COMPREHENSIVE METABOLIC PANEL
ALBUMIN: 4.2 g/dL (ref 3.6–5.1)
ALK PHOS: 75 U/L (ref 33–115)
ALT: 10 U/L (ref 6–29)
AST: 14 U/L (ref 10–30)
BUN: 20 mg/dL (ref 7–25)
CALCIUM: 9.5 mg/dL (ref 8.6–10.2)
CO2: 24 mmol/L (ref 20–31)
Chloride: 102 mmol/L (ref 98–110)
Creat: 0.85 mg/dL (ref 0.50–1.10)
Glucose, Bld: 81 mg/dL (ref 65–99)
Potassium: 4.2 mmol/L (ref 3.5–5.3)
Sodium: 138 mmol/L (ref 135–146)
Total Bilirubin: 0.4 mg/dL (ref 0.2–1.2)
Total Protein: 7.3 g/dL (ref 6.1–8.1)

## 2016-02-05 MED ORDER — SERTRALINE HCL 50 MG PO TABS: 50.0000 mg | ORAL_TABLET | Freq: Every day | ORAL | 4 refills | Status: DC

## 2016-02-05 NOTE — Progress Notes (Signed)
BONNEE ESTELA 11/23/1980 [id number]    History:    Presents for annual exam.  Mostly monthly cycle, breast-feeding daughter who is 35-year-old who is now also eating solids. Condoms. Husband planning on vasectomy. Normal Pap history. Healthy pregnancy but delivered at 35 weeks, Sharyl Nimrod is doing well. Malachi Pro is now 6 delivered full-term.  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 Zaidi and depression stable on Zoloft 50 mg. States has struggled with anxiety and depression for years declines need for counseling at this time.  ROS:  A ROS was performed and pertinent positives and negatives are included.  Exam:  Vitals:   02/05/16 1537  BP: 119/79  Weight: 179 lb 6.4 oz (81.4 kg)  Height: 5\' 5"  (1.651 m)   Body mass index is 29.85 kg/m.   General appearance:  Normal Thyroid:  Symmetrical, normal in size, without palpable masses or nodularity. Respiratory  Auscultation:  Clear without wheezing or rhonchi Cardiovascular  Auscultation:  Regular rate, without rubs, murmurs or gallops  Edema/varicosities:  Not grossly evident Abdominal  Soft,nontender, without masses, guarding or rebound.  Liver/spleen:  No organomegaly noted  Hernia:  None appreciated  Skin  Inspection:  Grossly normal   Breasts: Examined lying and sitting.     Right: Without masses, retractions, discharge or axillary adenopathy.     Left: Without masses, retractions, discharge or axillary adenopathy. Gentitourinary   Inguinal/mons:  Normal without inguinal adenopathy  External genitalia:  Normal  BUS/Urethra/Skene's glands:  Normal  Vagina:  Normal  Cervix:  Normal  Uterus:  Retroverted normal in size, shape and contour.  Midline and mobile  Adnexa/parametria:     Rt: Without masses or tenderness.   Lt: Without masses or tenderness.  Anus and  perineum: Normal  Digital rectal exam: Normal sphincter tone without palpated masses or tenderness  Assessment/Plan:  35 y.o. MWF G2 P2 for annual exam with no complaints.  Monthly cycle/condoms Breast-feeding Hypothyroid-Dr. Talmage Nap manages meds and labs Anxiety and depression stable on Zoloft  Plan: 50 mg by mouth daily prescription, proper use given and reviewed. Declines need for counseling.  SBE's, annual mammogram at 40, calcium rich diet, given her exercise and continue multivitamin daily encouraged. Contraception reviewed declines, husband planning vasectomy will continue condoms until negative sperm count. CBC, CMP, UA, Pap with HR HPV typing. New Pap screening  guidelines reviewed.    Harrington Challenger [facility], 4:37 PM 02/05/2016

## 2016-02-05 NOTE — Patient Instructions (Signed)
Health Maintenance, Female Adopting a healthy lifestyle and getting preventive care can go a long way to promote health and wellness. Talk with your health care provider about what schedule of regular examinations is right for you. This is a good chance for you to check in with your provider about disease prevention and staying healthy. In between checkups, there are plenty of things you can do on your own. Experts have done a lot of research about which lifestyle changes and preventive measures are most likely to keep you healthy. Ask your health care provider for more information. WEIGHT AND DIET  Eat a healthy diet  Be sure to include plenty of vegetables, fruits, low-fat dairy products, and lean protein.  Do not eat a lot of foods high in solid fats, added sugars, or salt.  Get regular exercise. This is one of the most important things you can do for your health.  Most adults should exercise for at least 150 minutes each week. The exercise should increase your heart rate and make you sweat (moderate-intensity exercise).  Most adults should also do strengthening exercises at least twice a week. This is in addition to the moderate-intensity exercise.  Maintain a healthy weight  Body mass index (BMI) is a measurement that can be used to identify possible weight problems. It estimates body fat based on height and weight. Your health care provider can help determine your BMI and help you achieve or maintain a healthy weight.  For females 20 years of age and older:   A BMI below 18.5 is considered underweight.  A BMI of 18.5 to 24.9 is normal.  A BMI of 25 to 29.9 is considered overweight.  A BMI of 30 and above is considered obese.  Watch levels of cholesterol and blood lipids  You should start having your blood tested for lipids and cholesterol at 35 years of age, then have this test every 5 years.  You may need to have your cholesterol levels checked more often if:  Your lipid  or cholesterol levels are high.  You are older than 35 years of age.  You are at high risk for heart disease.  CANCER SCREENING   Lung Cancer  Lung cancer screening is recommended for adults 55-80 years old who are at high risk for lung cancer because of a history of smoking.  A yearly low-dose CT scan of the lungs is recommended for people who:  Currently smoke.  Have quit within the past 15 years.  Have at least a 30-pack-year history of smoking. A pack year is smoking an average of one pack of cigarettes a day for 1 year.  Yearly screening should continue until it has been 15 years since you quit.  Yearly screening should stop if you develop a health problem that would prevent you from having lung cancer treatment.  Breast Cancer  Practice breast self-awareness. This means understanding how your breasts normally appear and feel.  It also means doing regular breast self-exams. Let your health care provider know about any changes, no matter how small.  If you are in your 20s or 30s, you should have a clinical breast exam (CBE) by a health care provider every 1-3 years as part of a regular health exam.  If you are 40 or older, have a CBE every year. Also consider having a breast X-ray (mammogram) every year.  If you have a family history of breast cancer, talk to your health care provider about genetic screening.  If you   are at high risk for breast cancer, talk to your health care provider about having an MRI and a mammogram every year.  Breast cancer gene (BRCA) assessment is recommended for women who have family members with BRCA-related cancers. BRCA-related cancers include:  Breast.  Ovarian.  Tubal.  Peritoneal cancers.  Results of the assessment will determine the need for genetic counseling and BRCA1 and BRCA2 testing. Cervical Cancer Your health care provider may recommend that you be screened regularly for cancer of the pelvic organs (ovaries, uterus, and  vagina). This screening involves a pelvic examination, including checking for microscopic changes to the surface of your cervix (Pap test). You may be encouraged to have this screening done every 3 years, beginning at age 21.  For women ages 30-65, health care providers may recommend pelvic exams and Pap testing every 3 years, or they may recommend the Pap and pelvic exam, combined with testing for human papilloma virus (HPV), every 5 years. Some types of HPV increase your risk of cervical cancer. Testing for HPV may also be done on women of any age with unclear Pap test results.  Other health care providers may not recommend any screening for nonpregnant women who are considered low risk for pelvic cancer and who do not have symptoms. Ask your health care provider if a screening pelvic exam is right for you.  If you have had past treatment for cervical cancer or a condition that could lead to cancer, you need Pap tests and screening for cancer for at least 20 years after your treatment. If Pap tests have been discontinued, your risk factors (such as having a new sexual partner) need to be reassessed to determine if screening should resume. Some women have medical problems that increase the chance of getting cervical cancer. In these cases, your health care provider may recommend more frequent screening and Pap tests. Colorectal Cancer  This type of cancer can be detected and often prevented.  Routine colorectal cancer screening usually begins at 35 years of age and continues through 35 years of age.  Your health care provider may recommend screening at an earlier age if you have risk factors for colon cancer.  Your health care provider may also recommend using home test kits to check for hidden blood in the stool.  A small camera at the end of a tube can be used to examine your colon directly (sigmoidoscopy or colonoscopy). This is done to check for the earliest forms of colorectal  cancer.  Routine screening usually begins at age 50.  Direct examination of the colon should be repeated every 5-10 years through 35 years of age. However, you may need to be screened more often if early forms of precancerous polyps or small growths are found. Skin Cancer  Check your skin from head to toe regularly.  Tell your health care provider about any new moles or changes in moles, especially if there is a change in a mole's shape or color.  Also tell your health care provider if you have a mole that is larger than the size of a pencil eraser.  Always use sunscreen. Apply sunscreen liberally and repeatedly throughout the day.  Protect yourself by wearing long sleeves, pants, a wide-brimmed hat, and sunglasses whenever you are outside. HEART DISEASE, DIABETES, AND HIGH BLOOD PRESSURE   High blood pressure causes heart disease and increases the risk of stroke. High blood pressure is more likely to develop in:  People who have blood pressure in the high end   of the normal range (130-139/85-89 mm Hg).  People who are overweight or obese.  People who are African American.  If you are 38-23 years of age, have your blood pressure checked every 3-5 years. If you are 61 years of age or older, have your blood pressure checked every year. You should have your blood pressure measured twice--once when you are at a hospital or clinic, and once when you are not at a hospital or clinic. Record the average of the two measurements. To check your blood pressure when you are not at a hospital or clinic, you can use:  An automated blood pressure machine at a pharmacy.  A home blood pressure monitor.  If you are between 45 years and 39 years old, ask your health care provider if you should take aspirin to prevent strokes.  Have regular diabetes screenings. This involves taking a blood sample to check your fasting blood sugar level.  If you are at a normal weight and have a low risk for diabetes,  have this test once every three years after 35 years of age.  If you are overweight and have a high risk for diabetes, consider being tested at a younger age or more often. PREVENTING INFECTION  Hepatitis B  If you have a higher risk for hepatitis B, you should be screened for this virus. You are considered at high risk for hepatitis B if:  You were born in a country where hepatitis B is common. Ask your health care provider which countries are considered high risk.  Your parents were born in a high-risk country, and you have not been immunized against hepatitis B (hepatitis B vaccine).  You have HIV or AIDS.  You use needles to inject street drugs.  You live with someone who has hepatitis B.  You have had sex with someone who has hepatitis B.  You get hemodialysis treatment.  You take certain medicines for conditions, including cancer, organ transplantation, and autoimmune conditions. Hepatitis C  Blood testing is recommended for:  Everyone born from [birth year] through 1965.  Anyone with known risk factors for hepatitis C. Sexually transmitted infections (STIs)  You should be screened for sexually transmitted infections (STIs) including gonorrhea and chlamydia if:  You are sexually active and are younger than 35 years of age.  You are older than 35 years of age and your health care provider tells you that you are at risk for this type of infection.  Your sexual activity has changed since you were last screened and you are at an increased risk for chlamydia or gonorrhea. Ask your health care provider if you are at risk.  If you do not have HIV, but are at risk, it may be recommended that you take a prescription medicine daily to prevent HIV infection. This is called pre-exposure prophylaxis (PrEP). You are considered at risk if:  You are sexually active and do not regularly use condoms or know the HIV status of your partner(s).  You take drugs by injection.  You are sexually  active with a partner who has HIV. Talk with your health care provider about whether you are at high risk of being infected with HIV. If you choose to begin PrEP, you should first be tested for HIV. You should then be tested every 3 months for as long as you are taking PrEP.  PREGNANCY   If you are premenopausal and you may become pregnant, ask your health care provider about preconception counseling.  If you may  become pregnant, take 400 to 800 micrograms (mcg) of folic acid every day.  If you want to prevent pregnancy, talk to your health care provider about birth control (contraception). OSTEOPOROSIS AND MENOPAUSE   Osteoporosis is a disease in which the bones lose minerals and strength with aging. This can result in serious bone fractures. Your risk for osteoporosis can be identified using a bone density scan.  If you are 61 years of age or older, or if you are at risk for osteoporosis and fractures, ask your health care provider if you should be screened.  Ask your health care provider whether you should take a calcium or vitamin D supplement to lower your risk for osteoporosis.  Menopause may have certain physical symptoms and risks.  Hormone replacement therapy may reduce some of these symptoms and risks. Talk to your health care provider about whether hormone replacement therapy is right for you.  HOME CARE INSTRUCTIONS   Schedule regular health, dental, and eye exams.  Stay current with your immunizations.   Do not use any tobacco products including cigarettes, chewing tobacco, or electronic cigarettes.  If you are pregnant, do not drink alcohol.  If you are breastfeeding, limit how much and how often you drink alcohol.  Limit alcohol intake to no more than 1 drink per day for nonpregnant women. One drink equals 12 ounces of beer, 5 ounces of wine, or 1 ounces of hard liquor.  Do not use street drugs.  Do not share needles.  Ask your health care provider for help if  you need support or information about quitting drugs.  Tell your health care provider if you often feel depressed.  Tell your health care provider if you have ever been abused or do not feel safe at home.   This information is not intended to replace advice given to you by your health care provider. Make sure you discuss any questions you have with your health care provider.   Document Released: 01/05/2011 Document Revised: 07/13/2014 Document Reviewed: 05/24/2013 Elsevier Interactive Patient Education [employer].

## 2016-02-06 LAB — CBC WITH DIFFERENTIAL/PLATELET
BASOS PCT: 1 %
Basophils Absolute: 135 cells/uL (ref 0–200)
EOS ABS: 270 {cells}/uL (ref 15–500)
Eosinophils Relative: 2 %
HEMATOCRIT: 38.9 % (ref 35.0–45.0)
HEMOGLOBIN: 13.1 g/dL (ref 11.7–15.5)
LYMPHS ABS: 3510 {cells}/uL (ref 850–3900)
Lymphocytes Relative: 26 %
MCH: 26.7 pg — ABNORMAL LOW (ref 27.0–33.0)
MCHC: 33.7 g/dL (ref 32.0–36.0)
MCV: 79.4 fL — ABNORMAL LOW (ref 80.0–100.0)
MONO ABS: 945 {cells}/uL (ref 200–950)
MPV: 11 fL (ref 7.5–12.5)
Monocytes Relative: 7 %
Neutro Abs: 8640 cells/uL — ABNORMAL HIGH (ref 1500–7800)
Neutrophils Relative %: 64 %
Platelets: 317 10*3/uL (ref 140–400)
RBC: 4.9 MIL/uL (ref 3.80–5.10)
RDW: 14.6 % (ref 11.0–15.0)
WBC: 13.5 10*3/uL — AB (ref 3.8–10.8)

## 2016-02-06 LAB — PAP, TP IMAGING W/ HPV RNA, RFLX HPV TYPE 16,18/45: HPV mRNA, High Risk: NOT DETECTED

## 2016-11-18 ENCOUNTER — Encounter: Payer: Self-pay | Admitting: Gynecology

## 2017-02-05 ENCOUNTER — Encounter: Payer: BC Managed Care – PPO | Admitting: Women's Health

## 2017-02-15 ENCOUNTER — Ambulatory Visit (INDEPENDENT_AMBULATORY_CARE_PROVIDER_SITE_OTHER): Payer: BC Managed Care – PPO | Admitting: Women's Health

## 2017-02-15 ENCOUNTER — Encounter: Payer: Self-pay | Admitting: Women's Health

## 2017-02-15 VITALS — BP 120/80 | Ht 65.0 in | Wt 170.0 lb

## 2017-02-15 DIAGNOSIS — B009 Herpesviral infection, unspecified: Secondary | ICD-10-CM | POA: Diagnosis not present

## 2017-02-15 DIAGNOSIS — F3289 Other specified depressive episodes: Secondary | ICD-10-CM | POA: Diagnosis not present

## 2017-02-15 DIAGNOSIS — Z01419 Encounter for gynecological examination (general) (routine) without abnormal findings: Secondary | ICD-10-CM | POA: Diagnosis not present

## 2017-02-15 LAB — CBC WITH DIFFERENTIAL/PLATELET
BASOS ABS: 0 {cells}/uL (ref 0–200)
Basophils Relative: 0 %
EOS PCT: 2 %
Eosinophils Absolute: 160 cells/uL (ref 15–500)
HEMATOCRIT: 38.8 % (ref 35.0–45.0)
Hemoglobin: 12.5 g/dL (ref 11.7–15.5)
LYMPHS ABS: 2000 {cells}/uL (ref 850–3900)
LYMPHS PCT: 25 %
MCH: 26.2 pg — AB (ref 27.0–33.0)
MCHC: 32.2 g/dL (ref 32.0–36.0)
MCV: 81.3 fL (ref 80.0–100.0)
MPV: 11.1 fL (ref 7.5–12.5)
Monocytes Absolute: 720 cells/uL (ref 200–950)
Monocytes Relative: 9 %
Neutro Abs: 5120 cells/uL (ref 1500–7800)
Neutrophils Relative %: 64 %
Platelets: 300 10*3/uL (ref 140–400)
RBC: 4.77 MIL/uL (ref 3.80–5.10)
RDW: 14.1 % (ref 11.0–15.0)
WBC: 8 10*3/uL (ref 3.8–10.8)

## 2017-02-15 LAB — GLUCOSE, RANDOM: Glucose, Bld: 86 mg/dL (ref 65–99)

## 2017-02-15 MED ORDER — VALACYCLOVIR HCL 500 MG PO TABS: 500.0000 mg | ORAL_TABLET | Freq: Two times a day (BID) | ORAL | 12 refills | Status: DC

## 2017-02-15 MED ORDER — SERTRALINE HCL 50 MG PO TABS: 50.0000 mg | ORAL_TABLET | Freq: Every day | ORAL | 4 refills | Status: DC

## 2017-02-15 NOTE — Progress Notes (Signed)
Samantha Koch [birth year] [id number]    History:    Presents for annual exam.  Monthly cycles/condoms, husband planning vasectomy. Normal Pap history. Anxiety and depression stable on Zoloft. Hypothyroid on Synthroid Dr. Talmage NapBalan manages.  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  Koch daughters Samantha Koch, Samantha Koch both doing well. Parents help care for children.  ROS:  A ROS was performed and pertinent positives and negatives are included.  Exam:  Vitals:   02/15/17 0935  BP: 120/80  Weight: 170 lb (77.1 kg)  Height: 5\' 5"  (1.651 m)   Body mass index is 28.29 kg/m.   General appearance:  Normal Thyroid:  Symmetrical, normal in size, without palpable masses or nodularity. Respiratory  Auscultation:  Clear without wheezing or rhonchi Cardiovascular  Auscultation:  Regular rate, without rubs, murmurs or gallops  Edema/varicosities:  Not grossly evident Abdominal  Soft,nontender, without masses, guarding or rebound.  Liver/spleen:  No organomegaly noted  Hernia:  None appreciated  Skin  Inspection:  Grossly normal   Breasts: Examined lying and sitting.     Right: Without masses, retractions, discharge or axillary adenopathy.     Left: Without masses, retractions, discharge or axillary adenopathy. Gentitourinary   Inguinal/mons:  Normal without inguinal adenopathy  External genitalia:  Normal  BUS/Urethra/Skene's glands:  Normal  Vagina:  Normal  Cervix:  Normal  Uterus:  normal in size, shape and contour.  Midline and mobile  Adnexa/parametria:     Rt: Without masses or tenderness.   Lt: Without masses or tenderness.  Anus and perineum: Normal  Digital rectal exam: Normal sphincter tone without palpated masses or tenderness  Assessment/Plan:  36 y.o. MWF G2 P2 for annual exam with no complaints.  Monthly cycle/condoms Hypothyroid-Dr. Talmage NapBalan Anxiety and depression  stable on Zoloft HSV-1  Plan: Contraception options reviewed and declined will continue condoms until vasectomy. Zoloft 50 mg by mouth daily prescription, proper use given and reviewed, denies need for counseling at this time. SBE's, exercise, calcium rich diet, vitamin D 1000 daily encouraged. Congratulated on 10 pound weight loss with diet and exercise. Valtrex 500 twice daily for 3-5 days as needed. CBC, glucose, Pap normal 2017, new screening guidelines reviewed.  Harrington Challengerancy J Koch [facility] LLCWHNP, 11:50 AM 02/15/2017

## 2017-02-15 NOTE — Patient Instructions (Signed)

## 2017-02-16 ENCOUNTER — Encounter: Payer: Self-pay | Admitting: Women's Health

## 2017-04-01 ENCOUNTER — Other Ambulatory Visit: Payer: Self-pay | Admitting: Women's Health

## 2018-02-16 ENCOUNTER — Ambulatory Visit (INDEPENDENT_AMBULATORY_CARE_PROVIDER_SITE_OTHER): Payer: BC Managed Care – PPO | Admitting: Women's Health

## 2018-02-16 ENCOUNTER — Encounter: Payer: Self-pay | Admitting: Women's Health

## 2018-02-16 VITALS — BP 118/80 | Ht 65.0 in | Wt 181.0 lb

## 2018-02-16 DIAGNOSIS — B009 Herpesviral infection, unspecified: Secondary | ICD-10-CM

## 2018-02-16 DIAGNOSIS — E038 Other specified hypothyroidism: Secondary | ICD-10-CM

## 2018-02-16 DIAGNOSIS — R350 Frequency of micturition: Secondary | ICD-10-CM

## 2018-02-16 DIAGNOSIS — Z01419 Encounter for gynecological examination (general) (routine) without abnormal findings: Secondary | ICD-10-CM

## 2018-02-16 DIAGNOSIS — R5383 Other fatigue: Secondary | ICD-10-CM

## 2018-02-16 DIAGNOSIS — Z13220 Encounter for screening for lipoid disorders: Secondary | ICD-10-CM

## 2018-02-16 MED ORDER — SERTRALINE HCL 50 MG PO TABS: 50.0000 mg | ORAL_TABLET | Freq: Every day | ORAL | 4 refills | Status: DC

## 2018-02-16 MED ORDER — VALACYCLOVIR HCL 500 MG PO TABS: 500.0000 mg | ORAL_TABLET | Freq: Two times a day (BID) | ORAL | 12 refills | Status: DC

## 2018-02-16 MED ORDER — LEVOTHYROXINE SODIUM 112 MCG PO TABS: 112.0000 ug | ORAL_TABLET | Freq: Every day | ORAL | 4 refills | Status: DC

## 2018-02-16 NOTE — Patient Instructions (Addendum)
Samantha Koch    [phone]       Health Maintenance, Female Adopting a healthy lifestyle and getting preventive care can go a long way to promote health and wellness. Talk with your health care provider about what schedule of regular examinations is right for you. This is a good chance for you to check in with your provider about disease prevention and staying healthy. In between checkups, there are plenty of things you can do on your own. Experts have done a lot of research about which lifestyle changes and preventive measures are most likely to keep you healthy. Ask your health care provider for more information. Weight and diet Eat a healthy diet  Be sure to include plenty of vegetables, fruits, low-fat dairy products, and lean protein.  Do not eat a lot of foods high in solid fats, added sugars, or salt.  Get regular exercise. This is one of the most important things you can do for your health. ? Most adults should exercise for at least 150 minutes each week. The exercise should increase your heart rate and make you sweat (moderate-intensity exercise). ? Most adults should also do strengthening exercises at least twice a week. This is in addition to the moderate-intensity exercise.  Maintain a healthy weight  Body mass index (BMI) is a measurement that can be used to identify possible weight problems. It estimates body fat based on height and weight. Your health care provider can help determine your BMI and help you achieve or maintain a healthy weight.  For females 20 years of age and older: ? A BMI below 18.5 is considered underweight. ? A BMI of 18.5 to 24.9 is normal. ? A BMI of 25 to 29.9 is considered overweight. ? A BMI of 30 and above is considered obese.  Watch levels of cholesterol and blood lipids  You should start having your blood tested for lipids and cholesterol at 37 years of age, then have this test every 5 years.  You may need to have your cholesterol levels checked  more often if: ? Your lipid or cholesterol levels are high. ? You are older than 37 years of age. ? You are at high risk for heart disease.  Cancer screening Lung Cancer  Lung cancer screening is recommended for adults 55-80 years old who are at high risk for lung cancer because of a history of smoking.  A yearly low-dose CT scan of the lungs is recommended for people who: ? Currently smoke. ? Have quit within the past 15 years. ? Have at least a 30-pack-year history of smoking. A pack year is smoking an average of one pack of cigarettes a day for 1 year.  Yearly screening should continue until it has been 15 years since you quit.  Yearly screening should stop if you develop a health problem that would prevent you from having lung cancer treatment.  Breast Cancer  Practice breast self-awareness. This means understanding how your breasts normally appear and feel.  It also means doing regular breast self-exams. Let your health care provider know about any changes, no matter how small.  If you are in your 20s or 30s, you should have a clinical breast exam (CBE) by a health care provider every 1-3 years as part of a regular health exam.  If you are 40 or older, have a CBE every year. Also consider having a breast X-ray (mammogram) every year.  If you have a family history of breast cancer, talk to your health   If you are at high risk for breast cancer, talk to your health care provider about having an MRI and a mammogram every year.  Breast cancer gene (BRCA) assessment is recommended for women who have family members with BRCA-related cancers. BRCA-related cancers include: ? Breast. ? Ovarian. ? Tubal. ? Peritoneal cancers.  Results of the assessment will determine the need for genetic counseling and BRCA1 and BRCA2 testing.  Cervical Cancer Your health care provider may recommend that you be screened regularly for cancer of the pelvic organs  (ovaries, uterus, and vagina). This screening involves a pelvic examination, including checking for microscopic changes to the surface of your cervix (Pap test). You may be encouraged to have this screening done every 3 years, beginning at age 27.  For women ages 19-65, health care providers may recommend pelvic exams and Pap testing every 3 years, or they may recommend the Pap and pelvic exam, combined with testing for human papilloma virus (HPV), every 5 years. Some types of HPV increase your risk of cervical cancer. Testing for HPV may also be done on women of any age with unclear Pap test results.  Other health care providers may not recommend any screening for nonpregnant women who are considered low risk for pelvic cancer and who do not have symptoms. Ask your health care provider if a screening pelvic exam is right for you.  If you have had past treatment for cervical cancer or a condition that could lead to cancer, you need Pap tests and screening for cancer for at least 20 years after your treatment. If Pap tests have been discontinued, your risk factors (such as having a new sexual partner) need to be reassessed to determine if screening should resume. Some women have medical problems that increase the chance of getting cervical cancer. In these cases, your health care provider may recommend more frequent screening and Pap tests.  Colorectal Cancer  This type of cancer can be detected and often prevented.  Routine colorectal cancer screening usually begins at 37 years of age and continues through 37 years of age.  Your health care provider may recommend screening at an earlier age if you have risk factors for colon cancer.  Your health care provider may also recommend using home test kits to check for hidden blood in the stool.  A small camera at the end of a tube can be used to examine your colon directly (sigmoidoscopy or colonoscopy). This is done to check for the earliest forms of  colorectal cancer.  Routine screening usually begins at age 23.  Direct examination of the colon should be repeated every 5-10 years through 37 years of age. However, you may need to be screened more often if early forms of precancerous polyps or small growths are found.  Skin Cancer  Check your skin from head to toe regularly.  Tell your health care provider about any new moles or changes in moles, especially if there is a change in a mole's shape or color.  Also tell your health care provider if you have a mole that is larger than the size of a pencil eraser.  Always use sunscreen. Apply sunscreen liberally and repeatedly throughout the day.  Protect yourself by wearing long sleeves, pants, a wide-brimmed hat, and sunglasses whenever you are outside.  Heart disease, diabetes, and high blood pressure  High blood pressure causes heart disease and increases the risk of stroke. High blood pressure is more likely to develop in: ? People who have blood  pressure in the high end of the normal range (130-139/85-89 mm Hg). ? People who are overweight or obese. ? People who are African American.  If you are 65-57 years of age, have your blood pressure checked every 3-5 years. If you are 45 years of age or older, have your blood pressure checked every year. You should have your blood pressure measured twice-once when you are at a hospital or clinic, and once when you are not at a hospital or clinic. Record the average of the two measurements. To check your blood pressure when you are not at a hospital or clinic, you can use: ? An automated blood pressure machine at a pharmacy. ? A home blood pressure monitor.  If you are between 10 years and 29 years old, ask your health care provider if you should take aspirin to prevent strokes.  Have regular diabetes screenings. This involves taking a blood sample to check your fasting blood sugar level. ? If you are at a normal weight and have a low risk  for diabetes, have this test once every three years after 37 years of age. ? If you are overweight and have a high risk for diabetes, consider being tested at a younger age or more often. Preventing infection Hepatitis B  If you have a higher risk for hepatitis B, you should be screened for this virus. You are considered at high risk for hepatitis B if: ? You were born in a country where hepatitis B is common. Ask your health care provider which countries are considered high risk. ? Your parents were born in a high-risk country, and you have not been immunized against hepatitis B (hepatitis B vaccine). ? You have HIV or AIDS. ? You use needles to inject street drugs. ? You live with someone who has hepatitis B. ? You have had sex with someone who has hepatitis B. ? You get hemodialysis treatment. ? You take certain medicines for conditions, including cancer, organ transplantation, and autoimmune conditions.  Hepatitis C  Blood testing is recommended for: ? Everyone born from [birth year] through 1965. ? Anyone with known risk factors for hepatitis C.  Sexually transmitted infections (STIs)  You should be screened for sexually transmitted infections (STIs) including gonorrhea and chlamydia if: ? You are sexually active and are younger than 37 years of age. ? You are older than 37 years of age and your health care provider tells you that you are at risk for this type of infection. ? Your sexual activity has changed since you were last screened and you are at an increased risk for chlamydia or gonorrhea. Ask your health care provider if you are at risk.  If you do not have HIV, but are at risk, it may be recommended that you take a prescription medicine daily to prevent HIV infection. This is called pre-exposure prophylaxis (PrEP). You are considered at risk if: ? You are sexually active and do not regularly use condoms or know the HIV status of your partner(s). ? You take drugs by  injection. ? You are sexually active with a partner who has HIV.  Talk with your health care provider about whether you are at high risk of being infected with HIV. If you choose to begin PrEP, you should first be tested for HIV. You should then be tested every 3 months for as long as you are taking PrEP. Pregnancy  If you are premenopausal and you may become pregnant, ask your health care provider about preconception  counseling.  If you may become pregnant, take 400 to 800 micrograms (mcg) of folic acid every day.  If you want to prevent pregnancy, talk to your health care provider about birth control (contraception). Osteoporosis and menopause  Osteoporosis is a disease in which the bones lose minerals and strength with aging. This can result in serious bone fractures. Your risk for osteoporosis can be identified using a bone density scan.  If you are 70 years of age or older, or if you are at risk for osteoporosis and fractures, ask your health care provider if you should be screened.  Ask your health care provider whether you should take a calcium or vitamin D supplement to lower your risk for osteoporosis.  Menopause may have certain physical symptoms and risks.  Hormone replacement therapy may reduce some of these symptoms and risks. Talk to your health care provider about whether hormone replacement therapy is right for you. Follow these instructions at home:  Schedule regular health, dental, and eye exams.  Stay current with your immunizations.  Do not use any tobacco products including cigarettes, chewing tobacco, or electronic cigarettes.  If you are pregnant, do not drink alcohol.  If you are breastfeeding, limit how much and how often you drink alcohol.  Limit alcohol intake to no more than 1 drink per day for nonpregnant women. One drink equals 12 ounces of beer, 5 ounces of wine, or 1 ounces of hard liquor.  Do not use street drugs.  Do not share needles.  Ask  your health care provider for help if you need support or information about quitting drugs.  Tell your health care provider if you often feel depressed.  Tell your health care provider if you have ever been abused or do not feel safe at home. This information is not intended to replace advice given to you by your health care provider. Make sure you discuss any questions you have with your health care provider. Document Released: 01/05/2011 Document Revised: 11/28/2015 Document Reviewed: 03/26/2015 Elsevier Interactive Patient Education  [employer].

## 2018-02-16 NOTE — Progress Notes (Signed)
Melida QuitterMarcia A Leonhart [birth year] [id number]    History:    Presents for annual exam.  Monthly cycle/condoms.  Normal Pap history.  HSV 1 rare outbreaks.  Hypothyroid Dr. Cleon GustinBallin manages.  Anxiety depression on zoloft, questions if she needs a different dose is having some increased fatigue.  Sister had genetic testing positive for Alport's which can cause kidney, visual and hearing loss, mother also positive for.  Past medical history, past surgical history, family history and social history were all reviewed and documented in the EPIC chart.  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  ROS:  A ROS was performed and pertinent positives and negatives are included.  Exam:  Vitals:   02/16/18 0903  BP: 118/80  Weight: 181 lb (82.1 kg)  Height: 5\' 5"  (1.651 m)   Body mass index is 30.12 kg/m.   General appearance:  Normal Thyroid:  Symmetrical, normal in size, without palpable masses or nodularity. Respiratory  Auscultation:  Clear without wheezing or rhonchi Cardiovascular  Auscultation:  Regular rate, without rubs, murmurs or gallops  Edema/varicosities:  Not grossly evident Abdominal  Soft,nontender, without masses, guarding or rebound.  Liver/spleen:  No organomegaly noted  Hernia:  None appreciated  Skin  Inspection:  Grossly normal   Breasts: Examined lying and sitting.     Right: Without masses, retractions, discharge or axillary adenopathy.     Left: Without masses, retractions, discharge or axillary adenopathy. Gentitourinary   Inguinal/mons:  Normal without inguinal adenopathy  External genitalia:  Normal  BUS/Urethra/Skene's glands:  Normal  Vagina:  Normal  Cervix:  Normal  Uterus:  normal in size, shape and contour.  Midline and mobile  Adnexa/parametria:     Rt: Without masses or tenderness.   Lt: Without masses or tenderness.  Anus and perineum: Normal  Digital rectal exam: Normal sphincter tone without palpated masses or  tenderness  Assessment/Plan:  37 y.o. MWF G2, P2 for annual exam.    Monthly cycle/vasectomy Hypothyroid on Synthroid with increased fatigue HSV 1 rare outbreaks Anxiety/depression on Zoloft  Plan: Encouraged counseling, increase leisure activities, self-care. Zoloft 50 mg p.o. daily prescription, proper use given and reviewed.  Urged to speak to a counselor concerning dose or change in medication. SBE's, mammogram at 40, calcium rich foods, vitamin D 1000 daily encouraged.  Contraception options reviewed, will continue condoms.  Synthroid 112 mcg p.o. daily prescription, proper use given and reviewed.  Exercise, decrease calorie/carbs for weight loss encouraged/10 pound weight gain.   Pap normal 2017, new screening guidelines reviewed.  CBC, glucose, lipid panel, TSH, T3, T4 UA, frequency  Harrington Challengerancy J Muad N[facility] Surgery CenterWHNP, 9:13 AM 02/16/2018

## 2018-02-17 DIAGNOSIS — R7989 Other specified abnormal findings of blood chemistry: Secondary | ICD-10-CM

## 2018-02-17 LAB — CBC WITH DIFFERENTIAL/PLATELET
Basophils Absolute: 78 cells/uL (ref 0–200)
Basophils Relative: 0.8 %
EOS ABS: 176 {cells}/uL (ref 15–500)
Eosinophils Relative: 1.8 %
HCT: 40.2 % (ref 35.0–45.0)
Hemoglobin: 13.1 g/dL (ref 11.7–15.5)
Lymphs Abs: 2715 cells/uL (ref 850–3900)
MCH: 26.5 pg — AB (ref 27.0–33.0)
MCHC: 32.6 g/dL (ref 32.0–36.0)
MCV: 81.4 fL (ref 80.0–100.0)
MPV: 11.3 fL (ref 7.5–12.5)
Monocytes Relative: 6.7 %
Neutro Abs: 6174 cells/uL (ref 1500–7800)
Neutrophils Relative %: 63 %
PLATELETS: 323 10*3/uL (ref 140–400)
RBC: 4.94 10*6/uL (ref 3.80–5.10)
RDW: 13.8 % (ref 11.0–15.0)
TOTAL LYMPHOCYTE: 27.7 %
WBC mixed population: 657 cells/uL (ref 200–950)
WBC: 9.8 10*3/uL (ref 3.8–10.8)

## 2018-02-17 LAB — LIPID PANEL
Cholesterol: 264 mg/dL — ABNORMAL HIGH (ref <200)
HDL: 40 mg/dL — ABNORMAL LOW (ref >50)
LDL Cholesterol (Calc): 168 mg/dL (calc) — ABNORMAL HIGH
Non-HDL Cholesterol (Calc): 224 mg/dL (calc) — ABNORMAL HIGH (ref <130)
Total CHOL/HDL Ratio: 6.6 (calc) — ABNORMAL HIGH (ref <5.0)
Triglycerides: 331 mg/dL — ABNORMAL HIGH (ref <150)

## 2018-02-17 LAB — T3 UPTAKE: T3 UPTAKE: 26 % (ref 22–35)

## 2018-02-17 LAB — T4
FREE THYROXINE INDEX: 2.7 (ref 1.4–3.8)
T4, Total: 10.4 ug/dL (ref 5.1–11.9)

## 2018-02-17 LAB — TSH: TSH: 5.27 mIU/L — ABNORMAL HIGH

## 2018-02-17 LAB — GLUCOSE, RANDOM: GLUCOSE: 103 mg/dL — AB (ref 65–99)

## 2018-02-18 LAB — URINALYSIS, COMPLETE W/RFL CULTURE
BILIRUBIN URINE: NEGATIVE
Bacteria, UA: NONE SEEN /HPF
GLUCOSE, UA: NEGATIVE
Hyaline Cast: NONE SEEN /LPF
Ketones, ur: NEGATIVE
LEUKOCYTE ESTERASE: NEGATIVE
NITRITES URINE, INITIAL: NEGATIVE
PH: 7.5 (ref 5.0–8.0)
Protein, ur: NEGATIVE
SPECIFIC GRAVITY, URINE: 1.018 (ref 1.001–1.03)
WBC, UA: NONE SEEN /HPF (ref 0–5)

## 2018-02-18 LAB — URINE CULTURE
MICRO NUMBER:: 90970736
Result:: NO GROWTH
SPECIMEN QUALITY:: ADEQUATE

## 2018-02-18 LAB — CULTURE INDICATED

## 2018-02-19 ENCOUNTER — Other Ambulatory Visit: Payer: Self-pay | Admitting: Women's Health

## 2018-02-19 DIAGNOSIS — E038 Other specified hypothyroidism: Secondary | ICD-10-CM

## 2018-02-21 MED ORDER — LEVOTHYROXINE SODIUM 125 MCG PO TABS: 125.0000 ug | ORAL_TABLET | Freq: Every day | ORAL | 1 refills | Status: DC

## 2018-02-21 NOTE — Telephone Encounter (Signed)
Per note "please call and review TSH high will need to increase synthroid from 112 to daily and recheck in 2 mo.  Rx sent

## 2018-03-15 ENCOUNTER — Other Ambulatory Visit: Payer: Self-pay | Admitting: Women's Health

## 2018-03-15 DIAGNOSIS — E038 Other specified hypothyroidism: Secondary | ICD-10-CM

## 2018-04-13 ENCOUNTER — Other Ambulatory Visit: Payer: BC Managed Care – PPO

## 2018-04-13 DIAGNOSIS — R7989 Other specified abnormal findings of blood chemistry: Secondary | ICD-10-CM

## 2018-04-13 LAB — TSH: TSH: 1.26 mIU/L

## 2018-04-14 ENCOUNTER — Other Ambulatory Visit: Payer: Self-pay | Admitting: Women's Health

## 2018-04-14 DIAGNOSIS — E038 Other specified hypothyroidism: Secondary | ICD-10-CM

## 2018-04-14 NOTE — Telephone Encounter (Signed)
Had TSH recheck yesterday. Was normal.

## 2019-02-17 ENCOUNTER — Other Ambulatory Visit: Payer: Self-pay

## 2019-02-20 ENCOUNTER — Other Ambulatory Visit: Payer: Self-pay

## 2019-02-20 ENCOUNTER — Encounter: Payer: Self-pay | Admitting: Women's Health

## 2019-02-20 ENCOUNTER — Ambulatory Visit: Payer: BC Managed Care – PPO | Admitting: Women's Health

## 2019-02-20 ENCOUNTER — Other Ambulatory Visit: Payer: Self-pay | Admitting: Women's Health

## 2019-02-20 VITALS — BP 112/78 | Ht 64.5 in | Wt 178.4 lb

## 2019-02-20 DIAGNOSIS — Z01419 Encounter for gynecological examination (general) (routine) without abnormal findings: Secondary | ICD-10-CM

## 2019-02-20 DIAGNOSIS — E038 Other specified hypothyroidism: Secondary | ICD-10-CM

## 2019-02-20 DIAGNOSIS — B009 Herpesviral infection, unspecified: Secondary | ICD-10-CM

## 2019-02-20 DIAGNOSIS — Z13220 Encounter for screening for lipoid disorders: Secondary | ICD-10-CM

## 2019-02-20 DIAGNOSIS — R7989 Other specified abnormal findings of blood chemistry: Secondary | ICD-10-CM | POA: Diagnosis not present

## 2019-02-20 MED ORDER — LEVOTHYROXINE SODIUM 125 MCG PO TABS: 125.0000 ug | ORAL_TABLET | Freq: Every day | ORAL | 1 refills | Status: DC

## 2019-02-20 MED ORDER — VALACYCLOVIR HCL 500 MG PO TABS: 500.0000 mg | ORAL_TABLET | Freq: Two times a day (BID) | ORAL | 12 refills | Status: DC

## 2019-02-20 MED ORDER — LEVOTHYROXINE SODIUM 125 MCG PO TABS: ORAL_TABLET | ORAL | 4 refills | Status: DC

## 2019-02-20 MED ORDER — SERTRALINE HCL 50 MG PO TABS: 50.0000 mg | ORAL_TABLET | Freq: Every day | ORAL | 4 refills | Status: DC

## 2019-02-20 NOTE — Addendum Note (Signed)
Addended by: Franchot Gallo on: 02/20/2019 05:14 PM   Modules accepted: Orders

## 2019-02-20 NOTE — Progress Notes (Signed)
Samantha Koch 08-Jul-1980 [id number]    History:    Presents for annual exam.  Monthly cycle/condoms.  Hypothyroid on Synthroid.  Had iodine therapy in the past.  Normal Pap history.  Anxiety and depression stable on Zoloft.  Past medical history, past surgical history, family history and social history were all reviewed and documented in the EPIC chart.  Music teacher.  [name] 9, [name] 4 both doing well.  Mother and sister both positive for Alport's disease which causes kidney, vision and hearing loss.  ROS:  A ROS was performed and pertinent positives and negatives are included.  Exam:  Vitals:   02/20/19 1621  BP: 112/78  Weight: 178 lb 6.4 oz (80.9 kg)  Height: 5' 4.5" (1.638 m)   Body mass index is 30.15 kg/m.   General appearance:  Normal Thyroid:  Symmetrical, normal in size, without palpable masses or nodularity. Respiratory  Auscultation:  Clear without wheezing or rhonchi Cardiovascular  Auscultation:  Regular rate, without rubs, murmurs or gallops  Edema/varicosities:  Not grossly evident Abdominal  Soft,nontender, without masses, guarding or rebound.  Liver/spleen:  No organomegaly noted  Hernia:  None appreciated  Skin  Inspection:  Grossly normal   Breasts: Examined lying and sitting.     Right: Without masses, retractions, discharge or axillary adenopathy.     Left: Without masses, retractions, discharge or axillary adenopathy. Gentitourinary   Inguinal/mons:  Normal without inguinal adenopathy  External genitalia:  Normal  BUS/Urethra/Skene's glands:  Normal  Vagina:  Normal  Cervix:  Normal  Uterus:  normal in size, shape and contour.  Midline and mobile  Adnexa/parametria:     Rt: Without masses or tenderness.   Lt: Without masses or tenderness.  Anus and perineum: Normal  Digital rectal exam: Normal sphincter tone without palpated masses or tenderness  Assessment/Plan:  38 y.o. MWF G2, P2 for annual exam with no complaints.  Regular monthly  cycle/condoms HSV-1 rare outbreaks Anxiety depression stable on Zoloft Hypothyroid on Synthroid  Plan: Contraception reviewed, declines will continue condoms.  Zoloft 50 mg p.o. daily prescription, proper use given and reviewed.  Valtrex 500 twice daily for 3 to 5 days as needed.  Synthroid 125 mcg p.o. daily prescription, proper use given and reviewed.  SBEs, exercise, calcium rich foods, MVI daily encouraged..  CBC, CMP, TSH, Pap with HR HPV typing, new screening guidelines reviewed.  Will check fasting lipid panel next year.    [place], 5:04 PM 02/20/2019

## 2019-02-21 LAB — PAP, TP IMAGING W/ HPV RNA, RFLX HPV TYPE 16,18/45: HPV DNA High Risk: NOT DETECTED

## 2019-02-27 ENCOUNTER — Other Ambulatory Visit: Payer: Self-pay

## 2019-02-27 ENCOUNTER — Other Ambulatory Visit: Payer: BC Managed Care – PPO

## 2019-02-27 DIAGNOSIS — E038 Other specified hypothyroidism: Secondary | ICD-10-CM

## 2019-02-27 DIAGNOSIS — Z13220 Encounter for screening for lipoid disorders: Secondary | ICD-10-CM

## 2019-02-27 DIAGNOSIS — Z01419 Encounter for gynecological examination (general) (routine) without abnormal findings: Secondary | ICD-10-CM

## 2019-02-27 NOTE — Addendum Note (Signed)
Addended by: Lorine Bears on: 02/27/2019 08:28 AM   Modules accepted: Orders

## 2019-02-28 LAB — CBC WITH DIFFERENTIAL/PLATELET
Absolute Monocytes: 564 cells/uL (ref 200–950)
Basophils Absolute: 64 cells/uL (ref 0–200)
Basophils Relative: 0.7 %
Eosinophils Absolute: 200 cells/uL (ref 15–500)
Eosinophils Relative: 2.2 %
HCT: 36.6 % (ref 35.0–45.0)
Hemoglobin: 11.9 g/dL (ref 11.7–15.5)
Lymphs Abs: 2648 cells/uL (ref 850–3900)
MCH: 26.4 pg — ABNORMAL LOW (ref 27.0–33.0)
MCHC: 32.5 g/dL (ref 32.0–36.0)
MCV: 81.3 fL (ref 80.0–100.0)
MPV: 11.4 fL (ref 7.5–12.5)
Monocytes Relative: 6.2 %
Neutro Abs: 5624 cells/uL (ref 1500–7800)
Neutrophils Relative %: 61.8 %
Platelets: 321 10*3/uL (ref 140–400)
RBC: 4.5 10*6/uL (ref 3.80–5.10)
RDW: 13.3 % (ref 11.0–15.0)
Total Lymphocyte: 29.1 %
WBC: 9.1 10*3/uL (ref 3.8–10.8)

## 2019-02-28 LAB — COMPREHENSIVE METABOLIC PANEL
AG Ratio: 1.6 (calc) (ref 1.0–2.5)
ALT: 11 U/L (ref 6–29)
AST: 15 U/L (ref 10–30)
Albumin: 4.1 g/dL (ref 3.6–5.1)
Alkaline phosphatase (APISO): 54 U/L (ref 31–125)
BUN: 13 mg/dL (ref 7–25)
CO2: 26 mmol/L (ref 20–32)
Calcium: 9.1 mg/dL (ref 8.6–10.2)
Chloride: 104 mmol/L (ref 98–110)
Creat: 0.74 mg/dL (ref 0.50–1.10)
Globulin: 2.6 g/dL (calc) (ref 1.9–3.7)
Glucose, Bld: 96 mg/dL (ref 65–99)
Potassium: 4.1 mmol/L (ref 3.5–5.3)
Sodium: 138 mmol/L (ref 135–146)
Total Bilirubin: 0.5 mg/dL (ref 0.2–1.2)
Total Protein: 6.7 g/dL (ref 6.1–8.1)

## 2019-02-28 LAB — LIPID PANEL
Cholesterol: 242 mg/dL — ABNORMAL HIGH (ref <200)
HDL: 40 mg/dL — ABNORMAL LOW (ref >=50)
LDL Cholesterol (Calc): 168 mg/dL (calc) — ABNORMAL HIGH
Non-HDL Cholesterol (Calc): 202 mg/dL (calc) — ABNORMAL HIGH (ref <130)
Total CHOL/HDL Ratio: 6.1 (calc) — ABNORMAL HIGH (ref <5.0)
Triglycerides: 181 mg/dL — ABNORMAL HIGH (ref <150)

## 2019-02-28 LAB — TSH: TSH: 0.46 mIU/L

## 2019-08-31 ENCOUNTER — Ambulatory Visit: Payer: BC Managed Care – PPO | Attending: Family

## 2019-08-31 DIAGNOSIS — Z23 Encounter for immunization: Secondary | ICD-10-CM | POA: Insufficient documentation

## 2019-08-31 NOTE — Progress Notes (Signed)
   Covid-19 Vaccination Clinic  Name:  Samantha Koch    MRN: [id number] DOB: 1981/02/12  08/31/2019  [name] was observed post Covid-19 immunization for 15 minutes without incidence. She was provided with Vaccine Information Sheet and instruction to access the V-Safe system.   [name] was instructed to call 911 with any severe reactions post vaccine: Marland Kitchen Difficulty breathing  . Swelling of your face and throat  . A fast heartbeat  . A bad rash all over your body  . Dizziness and weakness    Immunizations Administered    Name Date Dose VIS Date Route   Moderna COVID-19 Vaccine 08/31/2019 12:53 PM 0.5 mL 06/06/2019 Intramuscular   Manufacturer: Moderna   Lot: [id number]   NDC: 48592-763-94

## 2019-10-03 ENCOUNTER — Ambulatory Visit: Payer: BC Managed Care – PPO | Attending: Family

## 2019-10-03 ENCOUNTER — Ambulatory Visit: Payer: BC Managed Care – PPO

## 2019-10-03 DIAGNOSIS — Z23 Encounter for immunization: Secondary | ICD-10-CM

## 2019-10-03 NOTE — Progress Notes (Signed)
   Covid-19 Vaccination Clinic  Name:  Samantha Koch    MRN: [id number] DOB: 09/24/1980  10/03/2019  [name] was observed post Covid-19 immunization for 15 minutes without incident. She was provided with Vaccine Information Sheet and instruction to access the V-Safe system.   [name] was instructed to call 911 with any severe reactions post vaccine: Marland Kitchen Difficulty breathing  . Swelling of face and throat  . A fast heartbeat  . A bad rash all over body  . Dizziness and weakness   Immunizations Administered    Name Date Dose VIS Date Route   Moderna COVID-19 Vaccine 10/03/2019  3:58 PM 0.5 mL 06/06/2019 Intramuscular   Manufacturer: Moderna   Lot: [id number]   NDC: 19509-326-71

## 2020-02-02 DIAGNOSIS — Z20822 Contact with and (suspected) exposure to covid-19: Secondary | ICD-10-CM | POA: Diagnosis not present

## 2020-02-21 ENCOUNTER — Encounter: Payer: BC Managed Care – PPO | Admitting: Nurse Practitioner

## 2020-02-27 ENCOUNTER — Encounter: Payer: Self-pay | Admitting: Nurse Practitioner

## 2020-02-27 ENCOUNTER — Other Ambulatory Visit: Payer: Self-pay

## 2020-02-27 ENCOUNTER — Ambulatory Visit: Payer: BC Managed Care – PPO | Admitting: Nurse Practitioner

## 2020-02-27 VITALS — BP 118/78 | Ht 64.0 in | Wt 182.0 lb

## 2020-02-27 DIAGNOSIS — F329 Major depressive disorder, single episode, unspecified: Secondary | ICD-10-CM | POA: Diagnosis not present

## 2020-02-27 DIAGNOSIS — F419 Anxiety disorder, unspecified: Secondary | ICD-10-CM

## 2020-02-27 DIAGNOSIS — Z01419 Encounter for gynecological examination (general) (routine) without abnormal findings: Secondary | ICD-10-CM

## 2020-02-27 DIAGNOSIS — Z13220 Encounter for screening for lipoid disorders: Secondary | ICD-10-CM | POA: Diagnosis not present

## 2020-02-27 DIAGNOSIS — E039 Hypothyroidism, unspecified: Secondary | ICD-10-CM | POA: Diagnosis not present

## 2020-02-27 DIAGNOSIS — Z6831 Body mass index (BMI) 31.0-31.9, adult: Secondary | ICD-10-CM

## 2020-02-27 DIAGNOSIS — E669 Obesity, unspecified: Secondary | ICD-10-CM

## 2020-02-27 DIAGNOSIS — F32A Depression, unspecified: Secondary | ICD-10-CM

## 2020-02-27 MED ORDER — SERTRALINE HCL 100 MG PO TABS: 100.0000 mg | ORAL_TABLET | Freq: Every day | ORAL | 3 refills | Status: DC

## 2020-02-27 NOTE — Patient Instructions (Signed)
Health Maintenance, Female Adopting a healthy lifestyle and getting preventive care are important in promoting health and wellness. Ask your health care provider about:  The right schedule for you to have regular tests and exams.  Things you can do on your own to prevent diseases and keep yourself healthy. What should I know about diet, weight, and exercise? Eat a healthy diet   Eat a diet that includes plenty of vegetables, fruits, low-fat dairy products, and lean protein.  Do not eat a lot of foods that are high in solid fats, added sugars, or sodium. Maintain a healthy weight Body mass index (BMI) is used to identify weight problems. It estimates body fat based on height and weight. Your health care provider can help determine your BMI and help you achieve or maintain a healthy weight. Get regular exercise Get regular exercise. This is one of the most important things you can do for your health. Most adults should:  Exercise for at least 150 minutes each week. The exercise should increase your heart rate and make you sweat (moderate-intensity exercise).  Do strengthening exercises at least twice a week. This is in addition to the moderate-intensity exercise.  Spend less time sitting. Even light physical activity can be beneficial. Watch cholesterol and blood lipids Have your blood tested for lipids and cholesterol at 39 years of age, then have this test every 5 years. Have your cholesterol levels checked more often if:  Your lipid or cholesterol levels are high.  You are older than 40 years of age.  You are at high risk for heart disease. What should I know about cancer screening? Depending on your health history and family history, you may need to have cancer screening at various ages. This may include screening for:  Breast cancer.  Cervical cancer.  Colorectal cancer.  Skin cancer.  Lung cancer. What should I know about heart disease, diabetes, and high blood  pressure? Blood pressure and heart disease  High blood pressure causes heart disease and increases the risk of stroke. This is more likely to develop in people who have high blood pressure readings, are of African descent, or are overweight.  Have your blood pressure checked: ? Every 3-5 years if you are 18-39 years of age. ? Every year if you are 40 years old or older. Diabetes Have regular diabetes screenings. This checks your fasting blood sugar level. Have the screening done:  Once every three years after age 40 if you are at a normal weight and have a low risk for diabetes.  More often and at a younger age if you are overweight or have a high risk for diabetes. What should I know about preventing infection? Hepatitis B If you have a higher risk for hepatitis B, you should be screened for this virus. Talk with your health care provider to find out if you are at risk for hepatitis B infection. Hepatitis C Testing is recommended for:  Everyone born from 1945 through 1965.  Anyone with known risk factors for hepatitis C. Sexually transmitted infections (STIs)  Get screened for STIs, including gonorrhea and chlamydia, if: ? You are sexually active and are younger than 39 years of age. ? You are older than 39 years of age and your health care provider tells you that you are at risk for this type of infection. ? Your sexual activity has changed since you were last screened, and you are at increased risk for chlamydia or gonorrhea. Ask your health care provider if   you are at risk.  Ask your health care provider about whether you are at high risk for HIV. Your health care provider may recommend a prescription medicine to help prevent HIV infection. If you choose to take medicine to prevent HIV, you should first get tested for HIV. You should then be tested every 3 months for as long as you are taking the medicine. Pregnancy  If you are about to stop having your period (premenopausal) and  you may become pregnant, seek counseling before you get pregnant.  Take 400 to 800 micrograms (mcg) of folic acid every day if you become pregnant.  Ask for birth control (contraception) if you want to prevent pregnancy. Osteoporosis and menopause Osteoporosis is a disease in which the bones lose minerals and strength with aging. This can result in bone fractures. If you are 65 years old or older, or if you are at risk for osteoporosis and fractures, ask your health care provider if you should:  Be screened for bone loss.  Take a calcium or vitamin D supplement to lower your risk of fractures.  Be given hormone replacement therapy (HRT) to treat symptoms of menopause. Follow these instructions at home: Lifestyle  Do not use any products that contain nicotine or tobacco, such as cigarettes, e-cigarettes, and chewing tobacco. If you need help quitting, ask your health care provider.  Do not use street drugs.  Do not share needles.  Ask your health care provider for help if you need support or information about quitting drugs. Alcohol use  Do not drink alcohol if: ? Your health care provider tells you not to drink. ? You are pregnant, may be pregnant, or are planning to become pregnant.  If you drink alcohol: ? Limit how much you use to 0-1 drink a day. ? Limit intake if you are breastfeeding.  Be aware of how much alcohol is in your drink. In the [place] one drink equals one 12 oz bottle of beer (355 mL), one 5 oz glass of wine (148 mL), or one 1 oz glass of hard liquor (44 mL). General instructions  Schedule regular health, dental, and eye exams.  Stay current with your vaccines.  Tell your health care provider if: ? You often feel depressed. ? You have ever been abused or do not feel safe at home. Summary  Adopting a healthy lifestyle and getting preventive care are important in promoting health and wellness.  Follow your health care provider's instructions about healthy  diet, exercising, and getting tested or screened for diseases.  Follow your health care provider's instructions on monitoring your cholesterol and blood pressure. This information is not intended to replace advice given to you by your health care provider. Make sure you discuss any questions you have with your health care provider. Document Revised: 06/15/2018 Document Reviewed: 06/15/2018 Elsevier Patient Education  2020 [employer] Inc[employer]  

## 2020-02-27 NOTE — Progress Notes (Signed)
   Samantha Koch [birth year] [id number]   History:  39 y.o. G2P0102 presents for annual exam without GYN complaints. Monthly cycle/condoms. Normal pap history.  Hypothyroidism, on Synthroid. 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 Elevated cholesterol/LDL last year with recommendations to follow up with PCP. She did not follow up but she has been working on weight loss and is currently doing [employer] program.   Gynecologic History No LMP recorded.   Contraception: condoms Last Pap: 02/20/2019. Results were: normal  Past medical history, past surgical history, family history and social history were all reviewed and documented in the EPIC chart.  ROS:  A ROS was performed and pertinent positives and negatives are included.  Exam:  Vitals:   02/27/20 1611  BP: 118/78  Weight: 182 lb (82.6 kg)  Height: 5\' 4"  (1.626 m)   Body mass index is 31.24 kg/m.  General appearance:  Normal Thyroid:  Symmetrical, normal in size, without palpable masses or nodularity. Respiratory  Auscultation:  Clear without wheezing or rhonchi Cardiovascular  Auscultation:  Regular rate, without rubs, murmurs or gallops  Edema/varicosities:  Not grossly evident Abdominal  Soft,nontender, without masses, guarding or rebound.  Liver/spleen:  No organomegaly noted  Hernia:  None appreciated  Skin  Inspection:  Grossly normal   Breasts: Examined lying and sitting.   Right: Without masses, retractions, discharge or axillary adenopathy.   Left: Without masses, retractions, discharge or axillary adenopathy. Gentitourinary   Inguinal/mons:  Normal without inguinal adenopathy  External genitalia:  Normal  BUS/Urethra/Skene's glands:  Normal  Vagina:  Atrophy  Cervix:  Normal  Uterus:  Difficult to palpate due to body habitus but no gross masses or tenderness  Adnexa/parametria:     Rt: Without masses or  tenderness.   Lt: Without masses or tenderness.  Anus and perineum: Normal  Assessment/Plan:  39 y.o. 24 for annual exam.   Well female exam with routine gynecological exam - Plan: CBC with Differential/Platelet, Comprehensive metabolic panel. Education provided on SBEs, importance of preventative screenings, current guidelines, high calcium diet, regular exercise, and multivitamin daily.   Hypothyroidism, unspecified type - Plan: TSH  Anxiety and depression - Plan: sertraline (ZOLOFT) 100 MG tablet. 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 Will increase from 50 mg to 100 mg. Refill x 1 year provided.   Lipid screening - Plan: Lipid panel. Elevated cholesterol/LDL last year. Did not follow up with PCP per recommendations but has been working on weight loss through [id number] program.   Will return for fasting labs. Follow up in 1 year for annual.      [employer] [facility], 4:35 PM 02/27/2020

## 2020-03-04 ENCOUNTER — Other Ambulatory Visit: Payer: BC Managed Care – PPO

## 2020-03-04 ENCOUNTER — Other Ambulatory Visit: Payer: Self-pay

## 2020-03-04 DIAGNOSIS — Z01419 Encounter for gynecological examination (general) (routine) without abnormal findings: Secondary | ICD-10-CM

## 2020-03-04 DIAGNOSIS — E039 Hypothyroidism, unspecified: Secondary | ICD-10-CM

## 2020-03-04 DIAGNOSIS — Z13220 Encounter for screening for lipoid disorders: Secondary | ICD-10-CM | POA: Diagnosis not present

## 2020-03-04 DIAGNOSIS — R05 Cough: Secondary | ICD-10-CM | POA: Diagnosis not present

## 2020-03-04 DIAGNOSIS — Z20828 Contact with and (suspected) exposure to other viral communicable diseases: Secondary | ICD-10-CM | POA: Diagnosis not present

## 2020-03-05 ENCOUNTER — Other Ambulatory Visit: Payer: Self-pay | Admitting: Nurse Practitioner

## 2020-03-05 DIAGNOSIS — R05 Cough: Secondary | ICD-10-CM | POA: Diagnosis not present

## 2020-03-05 DIAGNOSIS — E039 Hypothyroidism, unspecified: Secondary | ICD-10-CM

## 2020-03-05 DIAGNOSIS — E782 Mixed hyperlipidemia: Secondary | ICD-10-CM

## 2020-03-05 DIAGNOSIS — Z20828 Contact with and (suspected) exposure to other viral communicable diseases: Secondary | ICD-10-CM | POA: Diagnosis not present

## 2020-03-05 LAB — CBC WITH DIFFERENTIAL/PLATELET
Absolute Monocytes: 731 cells/uL (ref 200–950)
Basophils Absolute: 58 cells/uL (ref 0–200)
Basophils Relative: 0.5 %
Eosinophils Absolute: 162 cells/uL (ref 15–500)
Eosinophils Relative: 1.4 %
HCT: 40.5 % (ref 35.0–45.0)
Hemoglobin: 13.3 g/dL (ref 11.7–15.5)
Lymphs Abs: 2703 cells/uL (ref 850–3900)
MCH: 26.4 pg — ABNORMAL LOW (ref 27.0–33.0)
MCHC: 32.8 g/dL (ref 32.0–36.0)
MCV: 80.5 fL (ref 80.0–100.0)
MPV: 11 fL (ref 7.5–12.5)
Monocytes Relative: 6.3 %
Neutro Abs: 7946 cells/uL — ABNORMAL HIGH (ref 1500–7800)
Neutrophils Relative %: 68.5 %
Platelets: 329 10*3/uL (ref 140–400)
RBC: 5.03 10*6/uL (ref 3.80–5.10)
RDW: 13.5 % (ref 11.0–15.0)
Total Lymphocyte: 23.3 %
WBC: 11.6 10*3/uL — ABNORMAL HIGH (ref 3.8–10.8)

## 2020-03-05 LAB — COMPREHENSIVE METABOLIC PANEL
AG Ratio: 1.4 (calc) (ref 1.0–2.5)
ALT: 11 U/L (ref 6–29)
AST: 14 U/L (ref 10–30)
Albumin: 4.4 g/dL (ref 3.6–5.1)
Alkaline phosphatase (APISO): 79 U/L (ref 31–125)
BUN: 10 mg/dL (ref 7–25)
CO2: 23 mmol/L (ref 20–32)
Calcium: 9.9 mg/dL (ref 8.6–10.2)
Chloride: 101 mmol/L (ref 98–110)
Creat: 0.74 mg/dL (ref 0.50–1.10)
Globulin: 3.1 g/dL (calc) (ref 1.9–3.7)
Glucose, Bld: 94 mg/dL (ref 65–99)
Potassium: 4.8 mmol/L (ref 3.5–5.3)
Sodium: 137 mmol/L (ref 135–146)
Total Bilirubin: 0.6 mg/dL (ref 0.2–1.2)
Total Protein: 7.5 g/dL (ref 6.1–8.1)

## 2020-03-05 LAB — LIPID PANEL
Cholesterol: 278 mg/dL — ABNORMAL HIGH (ref <200)
HDL: 48 mg/dL — ABNORMAL LOW (ref >=50)
LDL Cholesterol (Calc): 188 mg/dL (calc) — ABNORMAL HIGH
Non-HDL Cholesterol (Calc): 230 mg/dL (calc) — ABNORMAL HIGH (ref <130)
Total CHOL/HDL Ratio: 5.8 (calc) — ABNORMAL HIGH (ref <5.0)
Triglycerides: 226 mg/dL — ABNORMAL HIGH (ref <150)

## 2020-03-05 LAB — TSH: TSH: 0.2 mIU/L — ABNORMAL LOW

## 2020-03-05 MED ORDER — LEVOTHYROXINE SODIUM 112 MCG PO TABS: 112.0000 ug | ORAL_TABLET | Freq: Every day | ORAL | 0 refills | Status: DC

## 2020-04-12 ENCOUNTER — Other Ambulatory Visit: Payer: Self-pay

## 2020-04-12 DIAGNOSIS — F32A Depression, unspecified: Secondary | ICD-10-CM

## 2020-04-12 DIAGNOSIS — F419 Anxiety disorder, unspecified: Secondary | ICD-10-CM

## 2020-05-27 ENCOUNTER — Other Ambulatory Visit: Payer: Self-pay | Admitting: Nurse Practitioner

## 2020-05-27 DIAGNOSIS — E039 Hypothyroidism, unspecified: Secondary | ICD-10-CM

## 2020-06-03 ENCOUNTER — Other Ambulatory Visit: Payer: BC Managed Care – PPO

## 2020-06-03 ENCOUNTER — Other Ambulatory Visit: Payer: Self-pay

## 2020-06-03 DIAGNOSIS — E039 Hypothyroidism, unspecified: Secondary | ICD-10-CM

## 2020-06-03 DIAGNOSIS — E782 Mixed hyperlipidemia: Secondary | ICD-10-CM

## 2020-06-04 ENCOUNTER — Other Ambulatory Visit: Payer: Self-pay | Admitting: Nurse Practitioner

## 2020-06-04 DIAGNOSIS — E785 Hyperlipidemia, unspecified: Secondary | ICD-10-CM

## 2020-06-04 DIAGNOSIS — E039 Hypothyroidism, unspecified: Secondary | ICD-10-CM

## 2020-06-04 LAB — LIPID PANEL
Cholesterol: 269 mg/dL — ABNORMAL HIGH (ref <200)
HDL: 46 mg/dL — ABNORMAL LOW (ref >=50)
LDL Cholesterol (Calc): 171 mg/dL (calc) — ABNORMAL HIGH
Non-HDL Cholesterol (Calc): 223 mg/dL (calc) — ABNORMAL HIGH (ref <130)
Total CHOL/HDL Ratio: 5.8 (calc) — ABNORMAL HIGH (ref <5.0)
Triglycerides: 304 mg/dL — ABNORMAL HIGH (ref <150)

## 2020-06-04 LAB — TSH: TSH: 1.41 mIU/L

## 2020-06-04 MED ORDER — LEVOTHYROXINE SODIUM 112 MCG PO TABS: 112.0000 ug | ORAL_TABLET | Freq: Every day | ORAL | 1 refills | Status: DC

## 2020-11-29 ENCOUNTER — Other Ambulatory Visit: Payer: Self-pay | Admitting: Nurse Practitioner

## 2020-11-29 DIAGNOSIS — E039 Hypothyroidism, unspecified: Secondary | ICD-10-CM

## 2020-11-29 NOTE — Telephone Encounter (Signed)
I wanted her to return for a 6 month recheck and she is due now for that. I will provide a months supply if she needs time to get in for that lab draw.

## 2020-11-29 NOTE — Telephone Encounter (Signed)
Samantha Koch is see future orders for TSH level but I don't see a note about rechecking TSH?

## 2020-11-29 NOTE — Telephone Encounter (Signed)
I called and left detailed message on voicemail to schedule lab appointment for TSH recheck. 1 month supply sent.

## 2020-12-11 ENCOUNTER — Other Ambulatory Visit: Payer: Self-pay | Admitting: Nurse Practitioner

## 2020-12-11 ENCOUNTER — Other Ambulatory Visit: Payer: Self-pay | Admitting: *Deleted

## 2020-12-11 DIAGNOSIS — E039 Hypothyroidism, unspecified: Secondary | ICD-10-CM

## 2020-12-11 DIAGNOSIS — E785 Hyperlipidemia, unspecified: Secondary | ICD-10-CM

## 2020-12-12 ENCOUNTER — Other Ambulatory Visit: Payer: BC Managed Care – PPO

## 2020-12-12 ENCOUNTER — Other Ambulatory Visit: Payer: Self-pay

## 2020-12-12 DIAGNOSIS — E785 Hyperlipidemia, unspecified: Secondary | ICD-10-CM

## 2020-12-12 DIAGNOSIS — E039 Hypothyroidism, unspecified: Secondary | ICD-10-CM

## 2020-12-12 LAB — LIPID PANEL
Cholesterol: 302 mg/dL — ABNORMAL HIGH (ref <200)
HDL: 38 mg/dL — ABNORMAL LOW (ref >=50)
LDL Cholesterol (Calc): 201 mg/dL (calc) — ABNORMAL HIGH
Non-HDL Cholesterol (Calc): 264 mg/dL (calc) — ABNORMAL HIGH (ref <130)
Total CHOL/HDL Ratio: 7.9 (calc) — ABNORMAL HIGH (ref <5.0)
Triglycerides: 369 mg/dL — ABNORMAL HIGH (ref <150)

## 2020-12-12 LAB — TSH: TSH: 1.16 mIU/L

## 2020-12-13 ENCOUNTER — Other Ambulatory Visit: Payer: Self-pay | Admitting: Nurse Practitioner

## 2020-12-13 DIAGNOSIS — E039 Hypothyroidism, unspecified: Secondary | ICD-10-CM

## 2020-12-13 MED ORDER — LEVOTHYROXINE SODIUM 112 MCG PO TABS: 112.0000 ug | ORAL_TABLET | Freq: Every day | ORAL | 0 refills | Status: AC

## 2020-12-24 ENCOUNTER — Other Ambulatory Visit: Payer: Self-pay | Admitting: Nurse Practitioner

## 2020-12-24 DIAGNOSIS — E039 Hypothyroidism, unspecified: Secondary | ICD-10-CM

## 2021-01-31 ENCOUNTER — Telehealth: Payer: Self-pay | Admitting: *Deleted

## 2021-01-31 DIAGNOSIS — B009 Herpesviral infection, unspecified: Secondary | ICD-10-CM

## 2021-01-31 MED ORDER — VALACYCLOVIR HCL 500 MG PO TABS: 500.0000 mg | ORAL_TABLET | Freq: Two times a day (BID) | ORAL | 0 refills | Status: AC

## 2021-01-31 NOTE — Telephone Encounter (Signed)
Patient called requesting refill on Valtrex 500 mg tablet, she is currently out of start in [place], asked if Rx could be sent to pharmacy. Rx sent. Annual exam scheduled on 03/03/21.

## 2021-02-04 ENCOUNTER — Other Ambulatory Visit: Payer: Self-pay | Admitting: *Deleted

## 2021-02-07 ENCOUNTER — Other Ambulatory Visit: Payer: Self-pay | Admitting: Nurse Practitioner

## 2021-02-07 DIAGNOSIS — B009 Herpesviral infection, unspecified: Secondary | ICD-10-CM

## 2021-02-25 ENCOUNTER — Other Ambulatory Visit: Payer: Self-pay | Admitting: Nurse Practitioner

## 2021-02-25 DIAGNOSIS — F419 Anxiety disorder, unspecified: Secondary | ICD-10-CM

## 2021-02-25 DIAGNOSIS — F32A Depression, unspecified: Secondary | ICD-10-CM

## 2021-02-25 NOTE — Telephone Encounter (Signed)
Annual exam scheduled on 03/03/21 

## 2021-03-03 ENCOUNTER — Ambulatory Visit (INDEPENDENT_AMBULATORY_CARE_PROVIDER_SITE_OTHER): Payer: BC Managed Care – PPO | Admitting: Nurse Practitioner

## 2021-03-03 ENCOUNTER — Other Ambulatory Visit: Payer: Self-pay

## 2021-03-03 ENCOUNTER — Encounter: Payer: Self-pay | Admitting: Nurse Practitioner

## 2021-03-03 VITALS — BP 118/76 | Ht 63.5 in | Wt 189.0 lb

## 2021-03-03 DIAGNOSIS — Z01419 Encounter for gynecological examination (general) (routine) without abnormal findings: Secondary | ICD-10-CM

## 2021-03-03 DIAGNOSIS — F419 Anxiety disorder, unspecified: Secondary | ICD-10-CM

## 2021-03-03 DIAGNOSIS — F32A Depression, unspecified: Secondary | ICD-10-CM | POA: Diagnosis not present

## 2021-03-03 MED ORDER — SERTRALINE HCL 100 MG PO TABS: 100.0000 mg | ORAL_TABLET | Freq: Every day | ORAL | 3 refills | Status: DC

## 2021-03-03 NOTE — Progress Notes (Signed)
   Samantha Koch 1980/09/30 [id number]   History:  40 y.o. G2P0102 presents for annual exam without GYN complaints. Monthly cycle/condoms. Normal pap history.  Anxiety/depression managed well on Zoloft. 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 Hypothyroidism and HLD managed by PCP, started on Lipitor in June.   Gynecologic History Patient's last menstrual period was 02/25/2021. Period Cycle (Days): 28 Period Duration (Days): 5 Period Pattern: Regular Menstrual Flow: Heavy, Moderate Dysmenorrhea: (!) Mild Dysmenorrhea Symptoms: Cramping Contraception: condoms Sexually active: Yes  Health Maintenance Last Pap: 02/20/2019. Results were: Normal, 5-year repeat Last mammogram: 2013 Last colonoscopy: Not indicated Last Dexa: Not indicated  Past medical history, past surgical history, family history and social history were all reviewed and documented in the EPIC chart. 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   ROS:  A ROS was performed and pertinent positives and negatives are included.  Exam:  Vitals:   03/03/21 1556  BP: 118/76  Weight: 189 lb (85.7 kg)  Height: 5' 3.5" (1.613 m)    Body mass index is 32.95 kg/m.  General appearance:  Normal Thyroid:  Symmetrical, normal in size, without palpable masses or nodularity. Respiratory  Auscultation:  Clear without wheezing or rhonchi Cardiovascular  Auscultation:  Regular rate, without rubs, murmurs or gallops  Edema/varicosities:  Not grossly evident Abdominal  Soft,nontender, without masses, guarding or rebound.  Liver/spleen:  No organomegaly noted  Hernia:  None appreciated  Skin  Inspection:  Grossly normal   Breasts: Examined lying and sitting.   Right: Without masses, retractions, discharge or axillary adenopathy.   Left: Without masses, retractions, discharge or axillary adenopathy. Gentitourinary   Inguinal/mons:  Normal without inguinal adenopathy  External  genitalia:  Normal  BUS/Urethra/Skene's glands:  Normal  Vagina:  Normal  Cervix:  Normal  Uterus:  Difficult to palpate due to body habitus but no gross masses or tenderness  Adnexa/parametria:     Rt: Without masses or tenderness.   Lt: Without masses or tenderness.  Anus and perineum: Normal  Assessment/Plan:  40 y.o. [id number] for annual exam.   Well female exam with routine gynecological exam - Education provided on SBEs, importance of preventative screenings, current guidelines, high calcium diet, regular exercise, and multivitamin daily. Labs with PCP.   Anxiety and depression - Plan: sertraline (ZOLOFT) 100 MG tablet daily. Doing well and would like to continue. Refill x 1 year provided.   Screening for cervical cancer - Normal Pap history.  Will repeat at 5-year interval per guidelines.  Screening for breast cancer - Plans to schedule screening mammogram soon. Normal breast exam today.  Follow up in 1 year for annual.      Olivia Mackie [facility], 4:16 PM 03/03/2021

## 2021-03-19 ENCOUNTER — Other Ambulatory Visit: Payer: Self-pay | Admitting: Nurse Practitioner

## 2021-03-19 DIAGNOSIS — E039 Hypothyroidism, unspecified: Secondary | ICD-10-CM

## 2021-03-20 NOTE — Telephone Encounter (Signed)
Yes, her thyroid disease is now being management by her new PCP. I prefer the refill request be sent to them. Thank you.

## 2021-03-20 NOTE — Telephone Encounter (Signed)
Samantha Koch patient had normal TSH on  12/12/20. However she did have thyroid panel done with [facility]. Just FYI

## 2021-03-22 ENCOUNTER — Other Ambulatory Visit: Payer: Self-pay | Admitting: Nurse Practitioner

## 2021-03-22 DIAGNOSIS — E039 Hypothyroidism, unspecified: Secondary | ICD-10-CM

## 2021-12-22 ENCOUNTER — Other Ambulatory Visit: Payer: Self-pay | Admitting: Nurse Practitioner

## 2021-12-22 DIAGNOSIS — F419 Anxiety disorder, unspecified: Secondary | ICD-10-CM

## 2021-12-22 NOTE — Telephone Encounter (Signed)
Samantha Koch patient Annual exam scheduled on 03/04/22 Last annual exam was 02/2021

## 2022-01-12 ENCOUNTER — Other Ambulatory Visit: Payer: Self-pay | Admitting: Radiology

## 2022-01-12 DIAGNOSIS — F32A Depression, unspecified: Secondary | ICD-10-CM

## 2022-01-12 NOTE — Telephone Encounter (Signed)
Looks like this was just refilled with #90 by Clearnce Hasten.

## 2022-01-12 NOTE — Telephone Encounter (Signed)
AEX scheduled 03/04/22.

## 2022-03-04 ENCOUNTER — Ambulatory Visit: Payer: BC Managed Care – PPO | Admitting: Nurse Practitioner

## 2022-07-01 ENCOUNTER — Other Ambulatory Visit: Payer: Self-pay | Admitting: Radiology

## 2022-07-01 DIAGNOSIS — F419 Anxiety disorder, unspecified: Secondary | ICD-10-CM
